# Patient Record
Sex: Female | Born: 1955 | Hispanic: Yes | Marital: Single | Smoking: Former smoker
Health system: Southern US, Community
[De-identification: ages and names within clinical notes are randomized; demographics above are authoritative.]

## PROBLEM LIST (undated history)

## (undated) DIAGNOSIS — M1990 Unspecified osteoarthritis, unspecified site: Secondary | ICD-10-CM

## (undated) DIAGNOSIS — F32A Depression, unspecified: Secondary | ICD-10-CM

## (undated) DIAGNOSIS — R32 Unspecified urinary incontinence: Secondary | ICD-10-CM

## (undated) DIAGNOSIS — F329 Major depressive disorder, single episode, unspecified: Secondary | ICD-10-CM

## (undated) DIAGNOSIS — K635 Polyp of colon: Secondary | ICD-10-CM

## (undated) HISTORY — DX: Unspecified urinary incontinence: R32

## (undated) HISTORY — DX: Depression, unspecified: F32.A

## (undated) HISTORY — PX: ANKLE SURGERY: SHX546

## (undated) HISTORY — PX: BACK SURGERY: SHX140

## (undated) HISTORY — PX: CHOLECYSTECTOMY: SHX55

## (undated) HISTORY — DX: Major depressive disorder, single episode, unspecified: F32.9

## (undated) HISTORY — DX: Unspecified osteoarthritis, unspecified site: M19.90

## (undated) HISTORY — DX: Polyp of colon: K63.5

## (undated) HISTORY — PX: GASTRIC BYPASS: SHX52

---

## 2017-09-02 ENCOUNTER — Encounter: Payer: Self-pay | Admitting: Family Medicine

## 2017-09-02 ENCOUNTER — Ambulatory Visit: Payer: BLUE CROSS/BLUE SHIELD | Admitting: Family Medicine

## 2017-09-02 VITALS — BP 120/84 | HR 62 | Temp 97.8°F | Ht 64.0 in | Wt 233.0 lb

## 2017-09-02 DIAGNOSIS — F319 Bipolar disorder, unspecified: Secondary | ICD-10-CM | POA: Diagnosis not present

## 2017-09-02 DIAGNOSIS — M5441 Lumbago with sciatica, right side: Secondary | ICD-10-CM | POA: Diagnosis not present

## 2017-09-02 DIAGNOSIS — Z7689 Persons encountering health services in other specified circumstances: Secondary | ICD-10-CM

## 2017-09-02 DIAGNOSIS — M797 Fibromyalgia: Secondary | ICD-10-CM | POA: Diagnosis not present

## 2017-09-02 DIAGNOSIS — F4310 Post-traumatic stress disorder, unspecified: Secondary | ICD-10-CM

## 2017-09-02 DIAGNOSIS — G8929 Other chronic pain: Secondary | ICD-10-CM

## 2017-09-02 DIAGNOSIS — Z9884 Bariatric surgery status: Secondary | ICD-10-CM

## 2017-09-02 NOTE — Patient Instructions (Signed)
Myofascial Pain Syndrome and Fibromyalgia Myofascial pain syndrome and fibromyalgia are both pain disorders. This pain may be felt mainly in your muscles.  Myofascial pain syndrome: ? Always has trigger points or tender points in the muscle that will cause pain when pressed. The pain may come and go. ? Usually affects your neck, upper back, and shoulder areas. The pain often radiates into your arms and hands.  Fibromyalgia: ? Has muscle pains and tenderness that come and go. ? Is often associated with fatigue and sleep disturbances. ? Has trigger points. ? Tends to be long-lasting (chronic), but is not life-threatening.  Fibromyalgia and myofascial pain are not the same. However, they often occur together. If you have both conditions, each can make the other worse. Both are common and can cause enough pain and fatigue to make day-to-day activities difficult. What are the causes? The exact causes of fibromyalgia and myofascial pain are not known. People with certain gene types may be more likely to develop fibromyalgia. Some factors can be triggers for both conditions, such as:  Spine disorders.  Arthritis.  Severe injury (trauma) and other physical stressors.  Being under a lot of stress.  A medical illness.  What are the signs or symptoms? Fibromyalgia The main symptom of fibromyalgia is widespread pain and tenderness in your muscles. This can vary over time. Pain is sometimes described as stabbing, shooting, or burning. You may have tingling or numbness, too. You may also have sleep problems and fatigue. You may wake up feeling tired and groggy (fibro fog). Other symptoms may include:  Bowel and bladder problems.  Headaches.  Visual problems.  Problems with odors and noises.  Depression or mood changes.  Painful menstrual periods (dysmenorrhea).  Dry skin or eyes.  Myofascial pain syndrome Symptoms of myofascial pain syndrome include:  Tight, ropy bands of  muscle.  Uncomfortable sensations in muscular areas, such as: ? Aching. ? Cramping. ? Burning. ? Numbness. ? Tingling. ? Muscle weakness.  Trouble moving certain muscles freely (range of motion).  How is this diagnosed? There are no specific tests to diagnose fibromyalgia or myofascial pain syndrome. Both can be hard to diagnose because their symptoms are common in many other conditions. Your health care provider may suspect one or both of these conditions based on your symptoms and medical history. Your health care provider will also do a physical exam. The key to diagnosing fibromyalgia is having pain, fatigue, and other symptoms for more than three months that cannot be explained by another condition. The key to diagnosing myofascial pain syndrome is finding trigger points in muscles that are tender and cause pain elsewhere in your body (referred pain). How is this treated? Treating fibromyalgia and myofascial pain often requires a team of health care providers. This usually starts with your primary provider and a physical therapist. You may also find it helpful to work with alternative health care providers, such as massage therapists or acupuncturists. Treatment for fibromyalgia may include medicines. This may include nonsteroidal anti-inflammatory drugs (NSAIDs), along with other medicines. Treatment for myofascial pain may also include:  NSAIDs.  Cooling and stretching of muscles.  Trigger point injections.  Sound wave (ultrasound) treatments to stimulate muscles.  Follow these instructions at home:  Take medicines only as directed by your health care provider.  Exercise as directed by your health care provider or physical therapist.  Try to avoid stressful situations.  Practice relaxation techniques to control your stress. You may want to try: ? Biofeedback. ? Visual   imagery. ? Hypnosis. ? Muscle relaxation. ? Yoga. ? Meditation.  Talk to your health care provider  about alternative treatments, such as acupuncture or massage treatment.  Maintain a healthy lifestyle. This includes eating a healthy diet and getting enough sleep.  Consider joining a support group.  Do not do activities that stress or strain your muscles. That includes repetitive motions and heavy lifting. Where to find more information:  National Fibromyalgia Association: www.fmaware.org  Arthritis Foundation: www.arthritis.org  American Chronic Pain Association: www.theacpa.org/condition/myofascial-pain Contact a health care provider if:  You have new symptoms.  Your symptoms get worse.  You have side effects from your medicines.  You have trouble sleeping.  Your condition is causing depression or anxiety. This information is not intended to replace advice given to you by your health care provider. Make sure you discuss any questions you have with your health care provider. Document Released: 03/10/2005 Document Revised: 08/16/2015 Document Reviewed: 12/14/2013 Elsevier Interactive Patient Education  2018 Elsevier Inc.  
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
## 2017-10-07 ENCOUNTER — Ambulatory Visit: Payer: BLUE CROSS/BLUE SHIELD | Admitting: Family Medicine

## 2017-10-07 ENCOUNTER — Ambulatory Visit (INDEPENDENT_AMBULATORY_CARE_PROVIDER_SITE_OTHER): Payer: BLUE CROSS/BLUE SHIELD

## 2017-10-07 ENCOUNTER — Encounter: Payer: Self-pay | Admitting: Family Medicine

## 2017-10-07 VITALS — BP 128/84 | HR 58 | Temp 97.8°F | Wt 231.0 lb

## 2017-10-07 DIAGNOSIS — M797 Fibromyalgia: Secondary | ICD-10-CM | POA: Diagnosis not present

## 2017-10-07 DIAGNOSIS — M25511 Pain in right shoulder: Secondary | ICD-10-CM

## 2017-10-07 DIAGNOSIS — G8929 Other chronic pain: Secondary | ICD-10-CM | POA: Diagnosis not present

## 2017-10-07 MED ORDER — TIZANIDINE HCL 4 MG PO TABS: 4.0000 mg | ORAL_TABLET | Freq: Four times a day (QID) | ORAL | 3 refills | Status: DC | PRN

## 2017-10-07 MED ORDER — TIZANIDINE HCL 4 MG PO TABS: 4.0000 mg | ORAL_TABLET | Freq: Four times a day (QID) | ORAL | 3 refills | Status: AC | PRN

## 2017-10-07 NOTE — Patient Instructions (Signed)
Shoulder Pain Many things can cause shoulder pain, including:  An injury to the area.  Overuse of the shoulder.  Arthritis.  The source of the pain can be:  Inflammation.  An injury to the shoulder joint.  An injury to a tendon, ligament, or bone.  Follow these instructions at home: Take these actions to help with your pain:  Squeeze a soft ball or a foam pad as much as possible. This helps to keep the shoulder from swelling. It also helps to strengthen the arm.  Take over-the-counter and prescription medicines only as told by your health care provider.  If directed, apply ice to the area: ? Put ice in a plastic bag. ? Place a towel between your skin and the bag. ? Leave the ice on for 20 minutes, 2-3 times per day. Stop applying ice if it does not help with the pain.  If you were given a shoulder sling or immobilizer: ? Wear it as told. ? Remove it to shower or bathe. ? Move your arm as little as possible, but keep your hand moving to prevent swelling.  Contact a health care provider if:  Your pain gets worse.  Your pain is not relieved with medicines.  New pain develops in your arm, hand, or fingers. Get help right away if:  Your arm, hand, or fingers: ? Tingle. ? Become numb. ? Become swollen. ? Become painful. ? Turn white or blue. This information is not intended to replace advice given to you by your health care provider. Make sure you discuss any questions you have with your health care provider. Document Released: 12/18/2004 Document Revised: 11/04/2015 Document Reviewed: 07/03/2014 Elsevier Interactive Patient Education  2018 Elsevier Inc.  Shoulder Range of Motion Exercises Shoulder range of motion (ROM) exercises are designed to keep the shoulder moving freely. They are often recommended for people who have shoulder pain. Phase 1 exercises When you are able, do this exercise 5-6 days per week, or as told by your health care provider. Work toward  doing 2 sets of 10 swings. Pendulum Exercise How To Do This Exercise Lying Down 1. Lie face-down on a bed with your abdomen close to the side of the bed. 2. Let your arm hang over the side of the bed. 3. Relax your shoulder, arm, and hand. 4. Slowly and gently swing your arm forward and back. Do not use your neck muscles to swing your arm. They should be relaxed. If you are struggling to swing your arm, have someone gently swing it for you. When you do this exercise for the first time, swing your arm at a 15 degree angle for 15 seconds, or swing your arm 10 times. As pain lessens over time, increase the angle of the swing to 30-45 degrees. 5. Repeat steps 1-4 with the other arm.  How To Do This Exercise While Standing 1. Stand next to a sturdy chair or table and hold on to it with your hand. 1. Bend forward at the waist. 2. Bend your knees slightly. 3. Relax your other arm and let it hang limp. 4. Relax the shoulder blade of the arm that is hanging and let it drop. 5. While keeping your shoulder relaxed, use body motion to swing your arm in small circles. The first time you do this exercise, swing your arm for about 30 seconds or 10 times. When you do it next time, swing your arm for a little longer. 6. Stand up tall and relax. 7. Repeat steps   1-7, this time changing the direction of the circles. 2. Repeat steps 1-8 with the other arm.  Phase 2 exercises Do these exercises 3-4 times per day on 5-6 days per week or as told by your health care provider. Work toward holding the stretch for 20 seconds. Stretching Exercise 1 1. Lift your arm straight out in front of you. 2. Bend your arm 90 degrees at the elbow (right angle) so your forearm goes across your body and looks like the letter "L." 3. Use your other arm to gently pull the elbow forward and across your body. 4. Repeat steps 1-3 with the other arm. Stretching Exercise 2 You will need a towel or rope for this exercise. 1. Bend one arm  behind your back with the palm facing outward. 2. Hold a towel with your other hand. 3. Reach the arm that holds the towel above your head, and bend that arm at the elbow. Your wrist should be behind your neck. 4. Use your free hand to grab the free end of the towel. 5. With the higher hand, gently pull the towel up behind you. 6. With the lower hand, pull the towel down behind you. 7. Repeat steps 1-6 with the other arm.  Phase 3 exercises Do each of these exercises at four different times of day (sessions) every day or as told by your health care provider. To begin with, repeat each exercise 5 times (repetitions). Work toward doing 3 sets of 12 repetitions or as told by your health care provider. Strengthening Exercise 1 You will need a light weight for this activity. As you grow stronger, you may use a heavier weight. 1. Standing with a weight in your hand, lift your arm straight out to the side until it is at the same height as your shoulder. 2. Bend your arm at 90 degrees so that your fingers are pointing to the ceiling. 3. Slowly raise your hand until your arm is straight up in the air. 4. Repeat steps 1-3 with the other arm.  Strengthening Exercise 2 You will need a light weight for this activity. As you grow stronger, you may use a heavier weight. 1. Standing with a weight in your hand, gradually move your straight arm in an arc, starting at your side, then out in front of you, then straight up over your head. 2. Gradually move your other arm in an arc, starting at your side, then out in front of you, then straight up over your head. 3. Repeat steps 1-2 with the other arm.  Strengthening Exercise 3 You will need an elastic band for this activity. As you grow stronger, gradually increase the size of the bands or increase the number of bands that you use at one time. 1. While standing, hold an elastic band in one hand and raise that arm up in the air. 2. With your other hand, pull  down the band until that hand is by your side. 3. Repeat steps 1-2 with the other arm.  This information is not intended to replace advice given to you by your health care provider. Make sure you discuss any questions you have with your health care provider. Document Released: 12/07/2002 Document Revised: 11/04/2015 Document Reviewed: 03/06/2014 Elsevier Interactive Patient Education  2018 Elsevier Inc.  

## 2017-10-07 NOTE — Progress Notes (Signed)
Subjective:    Patient ID: Tina Harrison, female    DOB: [birth year], 62 y.o.   MRN: [id number]  No chief complaint on file.   HPI Patient was seen today for ongoing right shoulder pain.  Pt notes some improvement in pain since going to the gym and moving her arm more.  Pt has not taken any pain meds 2/2 h/o substance abuse.  She typically takes zanaflex prn for fibromyalgia and chronic back pain s/p surgery.  Also s/p L shoulder rotator cuff repair.  Of note Zanaflex has to be manufactured by Dr. Betti Cruzeddy, had issues with med from other manufacturers.  H/o bipolar d/o: -pt made an appt with Dr. Milagros Evenerupinder Kaur, but it is in Oct. -pt contacted her previous psychiatrist who is willing to do a telephone visit in order to re-prescribe patient's medications until she can be seen by her new psychiatrist. -Patient is taking Klonopin 2 mg as needed anxiety and Vilazodone 40 mg daily.  Past Medical History:  Diagnosis Date  . Arthritis   . Colon polyps   . Depression   . Urinary incontinence     Allergies  Allergen Reactions  . Morphine And Related     Causes hallucinations     ROS General: Denies fever, chills, night sweats, changes in weight, changes in appetite HEENT: Denies headaches, ear pain, changes in vision, rhinorrhea, sore throat CV: Denies CP, palpitations, SOB, orthopnea Pulm: Denies SOB, cough, wheezing GI: Denies abdominal pain, nausea, vomiting, diarrhea, constipation GU: Denies dysuria, hematuria, frequency, vaginal discharge Msk: Denies muscle cramps, joint pains  +R shoulder pain with movement, fibromyalgia Neuro: Denies weakness, numbness, tingling Skin: Denies rashes, bruising Psych: Denies depression, anxiety, hallucinations +h/o bipolar d/o    Objective:    Blood pressure 128/84, pulse (!) 58, temperature 97.8 F (36.6 C), temperature source Oral, weight 231 lb (104.8 kg), SpO2 98 %.   Gen. Pleasant, well-nourished, in no distress, normal affect  HEENT: [place]/AT,  face symmetric, no scleral icterus, PERRLA,  nares patent without drainage Lungs: no accessory muscle use, CTAB, no wheezes or rales Cardiovascular: RRR, no m/r/g, no peripheral edema Musculoskeletal: No deformities, TTP of mid R clavicle, AC joint, and scapula.  FROM with pain. +cross body, Hawkin's, and empty can.   no cyanosis or clubbing, normal tone  Neuro:  A&Ox3, CN II-XII intact, normal gait Skin:  Warm, no lesions/ rash   Wt Readings from Last 3 Encounters:  10/07/17 231 lb (104.8 kg)  09/02/17 233 lb (105.7 kg)    No results found for: WBC, HGB, HCT, PLT, GLUCOSE, CHOL, TRIG, HDL, LDLDIRECT, LDLCALC, ALT, AST, NA, K, CL, CREATININE, BUN, CO2, TSH, PSA, INR, GLUF, HGBA1C, MICROALBUR  Assessment/Plan:  Chronic right shoulder pain -improving  -heat, massage -continue exercising, given handout of shoulder exercises. -if shoulder pain continues will send to Ortho. - Plan: DG Shoulder Right, tiZANidine (ZANAFLEX) 4 MG tablet TID prn  Fibromyalgia  - Plan: tiZANidine (ZANAFLEX) 4 MG tablet  F/u in the next few months prn  Abbe AmsterdamShannon Banks, MD

## 2017-10-09 ENCOUNTER — Other Ambulatory Visit: Payer: Self-pay | Admitting: Family Medicine

## 2017-10-09 DIAGNOSIS — G8929 Other chronic pain: Secondary | ICD-10-CM

## 2017-10-09 DIAGNOSIS — M25511 Pain in right shoulder: Principal | ICD-10-CM

## 2017-10-15 ENCOUNTER — Ambulatory Visit: Payer: BLUE CROSS/BLUE SHIELD | Admitting: Physical Therapy

## 2017-10-20 ENCOUNTER — Other Ambulatory Visit: Payer: Self-pay

## 2017-10-20 ENCOUNTER — Encounter: Payer: Self-pay | Admitting: Physical Therapy

## 2017-10-20 ENCOUNTER — Ambulatory Visit: Payer: BLUE CROSS/BLUE SHIELD | Attending: Family Medicine | Admitting: Physical Therapy

## 2017-10-20 DIAGNOSIS — M6281 Muscle weakness (generalized): Secondary | ICD-10-CM | POA: Insufficient documentation

## 2017-10-20 DIAGNOSIS — M25611 Stiffness of right shoulder, not elsewhere classified: Secondary | ICD-10-CM | POA: Diagnosis present

## 2017-10-20 DIAGNOSIS — M25511 Pain in right shoulder: Secondary | ICD-10-CM | POA: Insufficient documentation

## 2017-10-20 NOTE — Patient Instructions (Signed)
      Hold all stretches 5-10 seconds and perform 5-10 times, 3 times a day.   Slide right arm up wall, with  PALM FACING THE WALL, by leaning toward wall.  Copyright  VHI. All rights reserved.   Sitting upright, slide forearm forward along table, bending from the waist until a stretch is felt. Copyright  VHI. All rights reserved.    Clasp hands together and raise arms above head, keeping elbows as straight as possible. Can be done sitting or lying.   Copyright  VHI. All rights reserved.    Tina Harrison PT [facility] [address], [address] [place], [place] [address] Phone # [phone] Fax [phone] 

## 2017-10-20 NOTE — Therapy (Signed)
[facility] [address], [address] [place], [place], [address] Phone: [phone]   Fax:  [phone]  Physical Therapy Evaluation  Patient Details  Name: Tina Harrison MRN: [id number] Date of Birth: [birth year] Referring Provider: Dr. Abbe Amsterdam   Encounter Date: 10/20/2017  PT End of Session - 10/20/17 2158    Visit Number  1    Date for PT Re-Evaluation  12/15/17    Authorization Type  BCBS    PT Start Time  1230    PT Stop Time  1310    PT Time Calculation (min)  40 min    Activity Tolerance  Patient tolerated treatment well       Past Medical History:  Diagnosis Date  . Arthritis   . Colon polyps   . Depression   . Urinary incontinence     Past Surgical History:  Procedure Laterality Date  . ANKLE SURGERY    . BACK SURGERY  2013,2017  . CHOLECYSTECTOMY    . GASTRIC BYPASS      There were no vitals filed for this visit.   Subjective Assessment - 10/20/17 1234    Subjective  My shoulder just hurts especially at night.  Started in April but has worsened over time.  I go to the gym and I work through it.  Works with a Psychologist, educational.  It took me 2 years to get to this point at the gym.      Pertinent History  herniated discs in neck 2017 had injections;  2 back surgeries with fusion; neuropathy;  bil carpal tunnel surgery;  left RTC surgery     Limitations  House hold activities    Diagnostic tests  x-ray "tendonitis"    Patient Stated Goals  lessen pain in shoulder and elbow    Currently in Pain?  Yes    Pain Score  6     Pain Location  Shoulder    Pain Orientation  Right;Anterior;Upper;Mid    Pain Type  Chronic pain    Pain Onset  More than a month ago    Aggravating Factors   night it radiates to my elbow on either side; bra strap;  sometimes reaching    Pain Relieving Factors  CBD ointment at night          [facility] PT Assessment - 10/20/17 0001      Assessment   Medical Diagnosis  chronic right shoulder  pain     Referring Provider  Dr. Abbe Amsterdam    Onset Date/Surgical Date  -- April    Hand Dominance  Right    Next MD Visit  as needed    Prior Therapy  other areas      Precautions   Precautions  None      Restrictions   Weight Bearing Restrictions  No      Balance Screen   Has the patient fallen in the past 6 months  No    Has the patient had a decrease in activity level because of a fear of falling?   No    Is the patient reluctant to leave their home because of a fear of falling?   No      Home Environment   Living Environment  Private residence    Living Arrangements  Alone      Prior Function   Level of Independence  Independent with basic ADLs    Vocation  Retired    Leisure  going to  the gym      Observation/Other Assessments   Focus on Therapeutic Outcomes (FOTO)   52% limitation       Posture/Postural Control   Posture/Postural Control  No significant limitations      AROM   Right Shoulder Flexion  110 Degrees    Right Shoulder ABduction  109 Degrees    Right Shoulder Internal Rotation  -- T 10    Right Shoulder External Rotation  48 Degrees    Left Shoulder Flexion  135 Degrees    Left Shoulder ABduction  150 Degrees    Left Shoulder Internal Rotation  -- T 10    Left Shoulder External Rotation  23 Degrees      Strength   Right Shoulder Flexion  3/5    Right Shoulder ABduction  3/5    Right Shoulder Internal Rotation  3/5    Right Shoulder External Rotation  3/5    Left Shoulder Flexion  4/5    Left Shoulder Extension  4/5    Left Shoulder ABduction  4/5    Left Shoulder Internal Rotation  4/5    Left Shoulder External Rotation  4/5      Hawkins-Kennedy test   Findings  Positive    Side  Right      Empty Can test   Findings  Positive    Side  Right      Lag time at 0 degrees   Findings  Positive    Side  Right      Drop Arm test   Findings  Positive    Side  Right                Objective measurements completed on  examination: See above findings.              PT Education - 10/20/17 2158    Education Details  Active assisted shoulder ROM 3x/day     Person(s) Educated  Patient    Methods  Explanation;Demonstration;Handout    Comprehension  Returned demonstration;Verbalized understanding       PT Short Term Goals - 10/20/17 2213      PT SHORT TERM GOAL #1   Title  The patient will demonstrate basic knowledge of pain control strategies and exercise to promote healing    Time  4    Period  Weeks    Status  New    Target Date  11/17/17      PT SHORT TERM GOAL #2   Title  The patient will report a 30% improvement in right shoulder pain at night time and usual ADLs    Time  4    Period  Weeks    Status  New      PT SHORT TERM GOAL #3   Title  The patient will have improved right shoulder flexion and abduction to 120 degrees needed for reaching    Time  4    Period  Weeks    Status  New        PT Long Term Goals - 10/20/17 2215      PT LONG TERM GOAL #1   Title  The patient will be independent with HEP and appropriate gym program for right shoulder    Time  8    Period  Weeks    Status  New    Target Date  12/15/17      PT LONG TERM GOAL #2   Title  The patient will  report a 60% reduction in shoulder pain at night time and with reaching     Time  8    Period  Weeks    Status  New      PT LONG TERM GOAL #3   Title  The patient will have improved right shoulder ROM with flexion and abduction  to 130 degrees, external rotation to 55 degrees needed for grooming, dressing, reaching     Time  8    Period  Weeks    Status  New      PT LONG TERM GOAL #4   Title  Right shoulder strength to grossly 4-/5 needed for light lifting    Time  8    Period  Weeks    Status  New      PT LONG TERM GOAL #5   Title  FOTO functional outcome score improved from 52% limitation to 36% indicating improved function with less pain     Time  8    Period  Weeks    Status  New              Plan - 10/20/17 2159    Clinical Impression Statement  The patient reports she had the onset of right shoulder pain in April for no apparent reason.  Her pain is especially bad at night time with sidelying on either side.  She also has some pain with reaching.  She has limited and painful ROM in all planes. Her strength is decreased grossly 3/5 throughout with pain with resisted movements.  Positive rotator cuff tests.  She would benefit from PT to address these deficits.      History and Personal Factors relevant to plan of care:  numerous co-morbidities including 2 back surgeries, cervical HNP, bilateral carpal tunnel surgeries, peripheral neuropathy; previous left rotator cuff surgery;  psychiatric disorder    Clinical Presentation  Evolving    Clinical Presentation due to:  worsening over time; numerous co-morbidities, numerous body regions and systems affected;  lives alone     Clinical Decision Making  Moderate    Rehab Potential  Good    Clinical Impairments Affecting Rehab Potential  see numerous co-morbidities above    PT Frequency  2x / week    PT Duration  8 weeks    PT Treatment/Interventions  ADLs/Self Care Home Management;Cryotherapy;Electrical Stimulation;Ultrasound;Moist Heat;Iontophoresis 4mg /ml Dexamethasone;Therapeutic exercise;Therapeutic activities;Neuromuscular re-education;Patient/family education;Dry needling;Taping    PT Next Visit Plan  review right shoulder active assisted ROM;  add isometrics;  glenohumeral and scapular mobs, ionto if cert signed;  TENS for pain control     Consulted and Agree with Plan of Care  Patient       Patient will benefit from skilled therapeutic intervention in order to improve the following deficits and impairments:  Pain, Decreased range of motion, Decreased strength, Impaired UE functional use  Visit Diagnosis: Acute pain of right shoulder - Plan: PT plan of care cert/re-cert  Stiffness of right shoulder, not elsewhere  classified - Plan: PT plan of care cert/re-cert  Muscle weakness (generalized) - Plan: PT plan of care cert/re-cert     Problem List Patient Active Problem List   Diagnosis Date Noted  .  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 Bipolar affective disorder (HCC) 09/02/2017  . 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 PTSD (post-traumatic stress disorder) 09/02/2017  . Fibromyalgia 09/02/2017   Lavinia Sharps, PT 10/20/17 10:25 PM Phone: [phone] Fax: [phone]  Lavinia Sharps  C 10/20/2017, 10:25 PM  [facility] [address], [address] [place], [place], [address] Phone: [phone]   Fax:  [phone]  Name: Imonie Tuch MRN: [id number] Date of Birth: 01-27-1956

## 2017-10-27 ENCOUNTER — Ambulatory Visit: Payer: BLUE CROSS/BLUE SHIELD | Attending: Family Medicine | Admitting: Physical Therapy

## 2017-10-27 ENCOUNTER — Encounter: Payer: Self-pay | Admitting: Physical Therapy

## 2017-10-27 DIAGNOSIS — M25611 Stiffness of right shoulder, not elsewhere classified: Secondary | ICD-10-CM | POA: Diagnosis present

## 2017-10-27 DIAGNOSIS — M6281 Muscle weakness (generalized): Secondary | ICD-10-CM | POA: Insufficient documentation

## 2017-10-27 DIAGNOSIS — M25511 Pain in right shoulder: Secondary | ICD-10-CM | POA: Diagnosis present

## 2017-10-27 NOTE — Therapy (Signed)
[facility] [address] [place], [place], [address] Phone: [phone]   Fax:  [phone]  Physical Therapy Treatment  Patient Details  Name: Tina Harrison MRN: [id number] Date of Birth: Jan 28, 1956 Referring Provider: Dr. Abbe Amsterdam   Encounter Date: 10/27/2017  PT End of Session - 10/27/17 1222    Visit Number  2    Date for PT Re-Evaluation  12/15/17    Authorization Type  BCBS    PT Start Time  1215    PT Stop Time  1300    PT Time Calculation (min)  45 min    Activity Tolerance  Patient tolerated treatment well       Past Medical History:  Diagnosis Date  . Arthritis   . Colon polyps   . Depression   . Urinary incontinence     Past Surgical History:  Procedure Laterality Date  . ANKLE SURGERY    . BACK SURGERY  2013,2017  . CHOLECYSTECTOMY    . GASTRIC BYPASS      There were no vitals filed for this visit.  Subjective Assessment - 10/27/17 1216    Subjective  My shoulder is in a lot of pain today and I wanted to go to the doctor to get a shot or something    Pertinent History  herniated discs in neck 2017 had injections;  2 back surgeries with fusion; neuropathy;  bil carpal tunnel surgery;  left RTC surgery     Limitations  House hold activities    Diagnostic tests  x-ray "tendonitis"    Patient Stated Goals  lessen pain in shoulder and elbow    Currently in Pain?  Yes    Pain Score  10-Worst pain ever    Pain Location  Shoulder    Pain Orientation  Right    Pain Descriptors / Indicators  Constant    Pain Type  Chronic pain    Pain Radiating Towards  pain radiating to the elbow    Pain Onset  More than a month ago    Aggravating Factors   nothing    Pain Relieving Factors  nothing    Multiple Pain Sites  No                       [facility] Adult PT Treatment/Exercise - 10/27/17 0001      Shoulder Exercises: Standing   Flexion  AAROM;Strengthening;Right;10 reps;Limitations     Flexion Limitations  using cane only getting to shoulder height and fatigues      Shoulder Exercises: Pulleys   Flexion  3 minutes      Shoulder Exercises: Isometric Strengthening   Flexion  5X5"    Extension  5X5"    External Rotation  5X5"    Internal Rotation  5X5"    ABduction  5X5"      Modalities   Modalities  Ultrasound;Iontophoresis      Ultrasound   Ultrasound Location  right shoulder and right elbow    Ultrasound Parameters  20% duty, 1.3 intensity; 5 min shoulder, 4 min elbow    Ultrasound Goals  Pain      Iontophoresis   Type of Iontophoresis  Dexamethasone    Location  right anterior shoulder    Dose  1.52mL    Time  4-6 hour release treatment #1             PT Education - 10/27/17 1312    Education Details  Access Code: [email or web address]     Person(s) Educated  Patient    Methods  Explanation;Demonstration;Verbal cues;Handout;Tactile cues    Comprehension  Verbalized understanding;Returned demonstration       PT Short Term Goals - 10/20/17 2213      PT SHORT TERM GOAL #1   Title  The patient will demonstrate basic knowledge of pain control strategies and exercise to promote healing    Time  4    Period  Weeks    Status  New    Target Date  11/17/17      PT SHORT TERM GOAL #2   Title  The patient will report a 30% improvement in right shoulder pain at night time and usual ADLs    Time  4    Period  Weeks    Status  New      PT SHORT TERM GOAL #3   Title  The patient will have improved right shoulder flexion and abduction to 120 degrees needed for reaching    Time  4    Period  Weeks    Status  New        PT Long Term Goals - 10/20/17 2215      PT LONG TERM GOAL #1   Title  The patient will be independent with HEP and appropriate gym program for right shoulder    Time  8    Period  Weeks    Status  New    Target Date  12/15/17      PT LONG TERM GOAL #2   Title  The patient will report a 60% reduction in shoulder pain at night time and  with reaching     Time  8    Period  Weeks    Status  New      PT LONG TERM GOAL #3   Title  The patient will have improved right shoulder ROM with flexion and abduction  to 130 degrees, external rotation to 55 degrees needed for grooming, dressing, reaching     Time  8    Period  Weeks    Status  New      PT LONG TERM GOAL #4   Title  Right shoulder strength to grossly 4-/5 needed for light lifting    Time  8    Period  Weeks    Status  New      PT LONG TERM GOAL #5   Title  FOTO functional outcome score improved from 52% limitation to 36% indicating improved function with less pain     Time  8    Period  Weeks    Status  New            Plan - 10/27/17 1312    Clinical Impression Statement  Pt presents with reports of very severe pain today, but facial expression does not match 10/10 pain level.  Pt tolerated exercises well and needed cues for improved posture and more scapular retraction and depression.  Pt had to immediate relief from US today.  Pt will benefit from skilled PT to address impairments.    PT Treatment/Interventions  ADLs/Self Care Home Management;Cryotherapy;Electrical Stimulation;Ultrasound;Moist Heat;Iontophoresis 4mg /ml Dexamethasone;Therapeutic exercise;Therapeutic activities;Neuromuscular re-education;Patient/family education;Dry needling;Taping    PT Next Visit Plan  f/u on HEP and response to ionto #1, AAROM, gentle strengthening as tolerated, estim and heat/ice for pain management    Consulted and Agree with Plan of Care  Patient       Patient will  benefit from skilled therapeutic intervention in order to improve the following deficits and impairments:  Pain, Decreased range of motion, Decreased strength, Impaired UE functional use  Visit Diagnosis: Acute pain of right shoulder  Stiffness of right shoulder, not elsewhere classified  Muscle weakness (generalized)     Problem List Patient Active Problem List   Diagnosis Date Noted  .  s/p  gastric bypass x 2 09/02/2017  . Bipolar affective disorder (HCC) 09/02/2017  . 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 PTSD (post-traumatic stress disorder) 09/02/2017  . Fibromyalgia 09/02/2017    Vincente Poli, PT 10/27/2017, 1:23 PM  [facility] [address], [address] [place], [place], [address] Phone: [phone]   Fax:  [phone]  Name: Tina Harrison MRN: [id number] Date of Birth: 11/24/1955

## 2017-10-27 NOTE — Patient Instructions (Signed)
Access Code: [email or web address]  URL: [email or web address]  Date: 10/27/2017  Prepared by: Dorie RankJacqueline Crosser   Exercises  Isometric Shoulder Flexion at Wall - 10 reps - 1 sets - 5 sec hold - 1x daily - 7x weekly  Isometric Shoulder Extension at Wall - 10 reps - 1 sets - 5 sec hold - 1x daily - 7x weekly  Isometric Shoulder External Rotation at Wall - 10 reps - 1 sets - 5 sec hold - 1x daily - 7x weekly  Standing Isometric Shoulder Internal Rotation at Doorway - 10 reps - 1 sets - 5 sec hold - 1x daily - 7x weekly  Isometric Shoulder Abduction at Wall - 10 reps - 1 sets - 5 sec hold - 1x daily - 7x weekly  Standing Shoulder Flexion AAROM with Dowel - 10 reps - 3 sets - 1x daily - 7x weekly  [facility] [address], [address] [place], [place] [address] Phone # [phone] Fax [phone]

## 2017-10-29 ENCOUNTER — Encounter: Payer: Self-pay | Admitting: Physical Therapy

## 2017-10-29 ENCOUNTER — Ambulatory Visit: Payer: BLUE CROSS/BLUE SHIELD | Admitting: Physical Therapy

## 2017-10-29 DIAGNOSIS — M6281 Muscle weakness (generalized): Secondary | ICD-10-CM

## 2017-10-29 DIAGNOSIS — M25611 Stiffness of right shoulder, not elsewhere classified: Secondary | ICD-10-CM

## 2017-10-29 DIAGNOSIS — M25511 Pain in right shoulder: Secondary | ICD-10-CM | POA: Diagnosis not present

## 2017-10-29 NOTE — Therapy (Signed)
[facility] [address], [address] [place], [place], [address] Phone: [phone]   Fax:  [phone]  Physical Therapy Treatment  Patient Details  Name: Tina Harrison MRN: [id number] Date of Birth: [birth year] Referring Provider: Dr. Abbe Amsterdam   Encounter Date: 10/29/2017  PT End of Session - 10/29/17 1309    Visit Number  3    Date for PT Re-Evaluation  12/15/17    Authorization Type  BCBS    PT Start Time  1230    PT Stop Time  1312    PT Time Calculation (min)  42 min       Past Medical History:  Diagnosis Date  . Arthritis   . Colon polyps   . Depression   . Urinary incontinence     Past Surgical History:  Procedure Laterality Date  . ANKLE SURGERY    . BACK SURGERY  2013,2017  . CHOLECYSTECTOMY    . GASTRIC BYPASS      There were no vitals filed for this visit.  Subjective Assessment - 10/29/17 1234    Subjective  My shoulder is a little better today but I haven't done anything today.    Patient Stated Goals  lessen pain in shoulder and elbow    Currently in Pain?  Yes    Pain Score  8     Pain Location  Shoulder    Pain Orientation  Right    Pain Descriptors / Indicators  Constant    Pain Type  Chronic pain    Pain Radiating Towards  to the elbow    Pain Onset  More than a month ago    Pain Frequency  Intermittent    Aggravating Factors   using it more, moving the arm    Pain Relieving Factors  rest    Multiple Pain Sites  No         [facility] PT Assessment - 10/29/17 0001      AROM   Right Shoulder Flexion  117 Degrees    Right Shoulder ABduction  60 Degrees                   [facility] Adult PT Treatment/Exercise - 10/29/17 0001      Shoulder Exercises: Supine   External Rotation  AAROM;Right;20 reps    Flexion  AAROM;Right;20 reps    ABduction  AAROM;Right;20 reps      Shoulder Exercises: Sidelying   External Rotation  Strengthening;Right;20 reps      Shoulder Exercises:  Standing   Flexion  AAROM;Strengthening;Right;20 reps      Shoulder Exercises: ROM/Strengthening   Other ROM/Strengthening Exercises  finger ladder to level 15 x 15; flexion towel slide 15x      Modalities   Modalities  Moist Heat;Electrical Stimulation      Moist Heat Therapy   Number Minutes Moist Heat  15 Minutes    Moist Heat Location  Shoulder      Electrical Stimulation   Electrical Stimulation Location  Rt shoulder    Electrical Stimulation Action  IFC    Electrical Stimulation Parameters  to tolerance x 11 min    Electrical Stimulation Goals  Pain      Iontophoresis   Type of Iontophoresis  Dexamethasone    Location  right anterior shoulder    Dose  1.76mL    Time  4-6 hour release               PT Short Term  Goals - 10/29/17 1239      PT SHORT TERM GOAL #1   Title  The patient will demonstrate basic knowledge of pain control strategies and exercise to promote healing    Baseline  uses biofreeze and CBD ointment  at night    Status  Achieved      PT SHORT TERM GOAL #2   Title  The patient will report a 30% improvement in right shoulder pain at night time and usual ADLs    Status  On-going      PT SHORT TERM GOAL #3   Title  The patient will have improved right shoulder flexion and abduction to 120 degrees needed for reaching    Status  On-going        PT Long Term Goals - 10/20/17 2215      PT LONG TERM GOAL #1   Title  The patient will be independent with HEP and appropriate gym program for right shoulder    Time  8    Period  Weeks    Status  New    Target Date  12/15/17      PT LONG TERM GOAL #2   Title  The patient will report a 60% reduction in shoulder pain at night time and with reaching     Time  8    Period  Weeks    Status  New      PT LONG TERM GOAL #3   Title  The patient will have improved right shoulder ROM with flexion and abduction  to 130 degrees, external rotation to 55 degrees needed for grooming, dressing, reaching      Time  8    Period  Weeks    Status  New      PT LONG TERM GOAL #4   Title  Right shoulder strength to grossly 4-/5 needed for light lifting    Time  8    Period  Weeks    Status  New      PT LONG TERM GOAL #5   Title  FOTO functional outcome score improved from 52% limitation to 36% indicating improved function with less pain     Time  8    Period  Weeks    Status  New            Plan - 10/29/17 1310    Clinical Impression Statement  Pt still having severe pain throughout the day.  Pt states she was able to do the isometric exercises.  She has some improvement of shoulder flexion but decreased abduction today.  Pt reported feeling better with stim and heat at the end of treatment today.  Pt will benefit from skilled PT to work on posture and shoulder strength    PT Treatment/Interventions  ADLs/Self Care Home Management;Cryotherapy;Electrical Stimulation;Ultrasound;Moist Heat;Iontophoresis 4mg /ml Dexamethasone;Therapeutic exercise;Therapeutic activities;Neuromuscular re-education;Patient/family education;Dry needling;Taping    PT Next Visit Plan  gentle strength and ROM as tolerated, scapular mobility and stability, posture, stim and heat for pain relief, f/u on ionto       Patient will benefit from skilled therapeutic intervention in order to improve the following deficits and impairments:  Pain, Decreased range of motion, Decreased strength, Impaired UE functional use  Visit Diagnosis: Acute pain of right shoulder  Stiffness of right shoulder, not elsewhere classified  Muscle weakness (generalized)     Problem List Patient Active Problem List   Diagnosis Date Noted  .  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  Bipolar affective disorder (HCC) 09/02/2017  . 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 PTSD (post-traumatic stress disorder) 09/02/2017  . Fibromyalgia 09/02/2017    Vincente PoliJakki Crosser, PT 10/29/2017, 1:46 PM  [facility] [address], [address] [place], [place], [address] Phone: [phone]   Fax:  [phone]  Name: Cheron Schaumannlma Sturdy MRN: [id number] Date of Birth: 1955-06-08

## 2017-11-03 ENCOUNTER — Ambulatory Visit: Payer: BLUE CROSS/BLUE SHIELD | Admitting: Physical Therapy

## 2017-11-03 ENCOUNTER — Encounter: Payer: Self-pay | Admitting: Physical Therapy

## 2017-11-03 DIAGNOSIS — M25511 Pain in right shoulder: Secondary | ICD-10-CM | POA: Diagnosis not present

## 2017-11-03 DIAGNOSIS — M6281 Muscle weakness (generalized): Secondary | ICD-10-CM

## 2017-11-03 DIAGNOSIS — M25611 Stiffness of right shoulder, not elsewhere classified: Secondary | ICD-10-CM

## 2017-11-03 NOTE — Patient Instructions (Signed)
Access Code: [email or web address]  URL: [email or web address]  Date: 11/03/2017  Prepared by: Dorie RankJacqueline Crosser   Exercises  Isometric Shoulder Flexion at Wall - 10 reps - 1 sets - 5 sec hold - 1x daily - 7x weekly  Isometric Shoulder Extension at Wall - 10 reps - 1 sets - 5 sec hold - 1x daily - 7x weekly  Isometric Shoulder External Rotation at Wall - 10 reps - 1 sets - 5 sec hold - 1x daily - 7x weekly  Standing Isometric Shoulder Internal Rotation at Doorway - 10 reps - 1 sets - 5 sec hold - 1x daily - 7x weekly  Isometric Shoulder Abduction at Wall - 10 reps - 1 sets - 5 sec hold - 1x daily - 7x weekly  Standing Shoulder Flexion AAROM with Dowel - 10 reps - 3 sets - 1x daily - 7x weekly  Supine Head Nod Deep Neck Flexor Training - 10 reps - 1 sets - 5 sec hold - 2x daily - 7x weekly  Seated Scapular Retraction - 10 reps - 1 sets - 5 sec hold - 3x daily - 7x weekly

## 2017-11-03 NOTE — Therapy (Signed)
[facility] [address] [place], [place], [address] Phone: [phone]   Fax:  [phone]  Physical Therapy Treatment  Patient Details  Name: Tina Harrison MRN: [id number] Date of Birth: [birth year] Referring Provider: Dr. Abbe Amsterdam   Encounter Date: 11/03/2017  PT End of Session - 11/03/17 1229    Visit Number  4    Date for PT Re-Evaluation  12/15/17    Authorization Type  BCBS    PT Start Time  1229    PT Stop Time  1317    PT Time Calculation (min)  48 min    Activity Tolerance  Patient tolerated treatment well       Past Medical History:  Diagnosis Date  . Arthritis   . Colon polyps   . Depression   . Urinary incontinence     Past Surgical History:  Procedure Laterality Date  . ANKLE SURGERY    . BACK SURGERY  2013,2017  . CHOLECYSTECTOMY    . GASTRIC BYPASS      There were no vitals filed for this visit.  Subjective Assessment - 11/03/17 1238    Subjective  I try to keep moving because I don't want to get stiff.  I feel pain sometimes in the side and sometimes under the arm, sometimes in the wrist and elbow.  Pt states the ionto helped that day and when it was on, but not long term feeling better.    Pertinent History  herniated discs in neck 2017 had injections;  2 back surgeries with fusion; neuropathy;  bil carpal tunnel surgery;  left RTC surgery     Diagnostic tests  x-ray "tendonitis"    Patient Stated Goals  lessen pain in shoulder and elbow    Currently in Pain?  Yes    Pain Score  8     Pain Location  Shoulder    Pain Orientation  Right    Pain Descriptors / Indicators  Constant    Pain Type  Chronic pain    Pain Radiating Towards  all the way down to the wrist and under the armpit    Pain Onset  More than a month ago    Pain Frequency  Intermittent    Aggravating Factors   moving    Pain Relieving Factors  rest    Effect of Pain on Daily Activities  doing things    Multiple  Pain Sites  No                       [facility] Adult PT Treatment/Exercise - 11/03/17 0001      Shoulder Exercises: Supine   Other Supine Exercises  head nods, scap squeeze - 10x 5sec      Shoulder Exercises: Seated   Other Seated Exercises  scap squeeze 10x 5 sec    Other Seated Exercises  upper trap stretches - 3 x 10 sec      Shoulder Exercises: Pulleys   Flexion  3 minutes   cues for posture     Moist Heat Therapy   Number Minutes Moist Heat  15 Minutes    Moist Heat Location  Shoulder      Electrical Stimulation   Electrical Stimulation Location  Rt shoulder    Electrical Stimulation Action  IFC    Electrical Stimulation Parameters  to tolerance x 15 min    Electrical Stimulation Goals  Pain      Manual Therapy  Manual Therapy  Soft tissue mobilization    Manual therapy comments  supine pillow under knees    Soft tissue mobilization  gentle soft tissue to right upper trap, suboccipitals, cervical distraction             PT Education - 11/03/17 1309    Education Details   Access Code: [email or web address]     Person(s) Educated  Patient    Methods  Explanation;Demonstration;Handout    Comprehension  Verbalized understanding;Returned demonstration       PT Short Term Goals - 10/29/17 1239      PT SHORT TERM GOAL #1   Title  The patient will demonstrate basic knowledge of pain control strategies and exercise to promote healing    Baseline  uses biofreeze and CBD ointment  at night    Status  Achieved      PT SHORT TERM GOAL #2   Title  The patient will report a 30% improvement in right shoulder pain at night time and usual ADLs    Status  On-going      PT SHORT TERM GOAL #3   Title  The patient will have improved right shoulder flexion and abduction to 120 degrees needed for reaching    Status  On-going        PT Long Term Goals - 10/20/17 2215      PT LONG TERM GOAL #1   Title  The patient will be independent with HEP and appropriate gym program  for right shoulder    Time  8    Period  Weeks    Status  New    Target Date  12/15/17      PT LONG TERM GOAL #2   Title  The patient will report a 60% reduction in shoulder pain at night time and with reaching     Time  8    Period  Weeks    Status  New      PT LONG TERM GOAL #3   Title  The patient will have improved right shoulder ROM with flexion and abduction  to 130 degrees, external rotation to 55 degrees needed for grooming, dressing, reaching     Time  8    Period  Weeks    Status  New      PT LONG TERM GOAL #4   Title  Right shoulder strength to grossly 4-/5 needed for light lifting    Time  8    Period  Weeks    Status  New      PT LONG TERM GOAL #5   Title  FOTO functional outcome score improved from 52% limitation to 36% indicating improved function with less pain     Time  8    Period  Weeks    Status  New            Plan - 11/03/17 1309    Clinical Impression Statement  Pt continues to have high level of pain but states no increased pain with current exercises.  Patient needed cues for posture during seated exercises.  Patient has very rounded shoudlers with forward head.  She was able to feel muscle fatigue with postural exercises today.  Pt could not tolerate much pressure with STM so was not able to experience much release.  PT will benefit from skilled PT to continue with POC    PT Treatment/Interventions  ADLs/Self Care Home Management;Cryotherapy;Electrical Stimulation;Ultrasound;Moist Heat;Iontophoresis 4mg /ml Dexamethasone;Therapeutic exercise;Therapeutic activities;Neuromuscular re-education;Patient/family education;Dry needling;Taping  PT Next Visit Plan  gentle strength and ROM as tolerated, scapular mobility and stability, cervical posture, stim and heat for pain relief    PT Home Exercise Plan   Access Code: [email or web address]     Consulted and Agree with Plan of Care  Patient       Patient will benefit from skilled therapeutic intervention in order  to improve the following deficits and impairments:  Pain, Decreased range of motion, Decreased strength, Impaired UE functional use  Visit Diagnosis: Acute pain of right shoulder  Stiffness of right shoulder, not elsewhere classified  Muscle weakness (generalized)     Problem List Patient Active Problem List   Diagnosis Date Noted  .  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 Bipolar affective disorder (HCC) 09/02/2017  . 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 PTSD (post-traumatic stress disorder) 09/02/2017  . Fibromyalgia 09/02/2017    Vincente PoliJakki Crosser, PT 11/03/2017, 1:18 PM  [facility] [address], [address] [place], [place], [address] Phone: [phone]   Fax:  [phone]  Name: Tina Harrison MRN: [id number] Date of Birth: 07-01-1955

## 2017-11-05 ENCOUNTER — Encounter: Payer: BLUE CROSS/BLUE SHIELD | Admitting: Physical Therapy

## 2017-11-11 ENCOUNTER — Ambulatory Visit: Payer: BLUE CROSS/BLUE SHIELD | Admitting: Physical Therapy

## 2017-11-11 ENCOUNTER — Encounter: Payer: Self-pay | Admitting: Physical Therapy

## 2017-11-11 DIAGNOSIS — M25611 Stiffness of right shoulder, not elsewhere classified: Secondary | ICD-10-CM

## 2017-11-11 DIAGNOSIS — M25511 Pain in right shoulder: Secondary | ICD-10-CM

## 2017-11-11 DIAGNOSIS — M6281 Muscle weakness (generalized): Secondary | ICD-10-CM

## 2017-11-11 NOTE — Therapy (Signed)
[facility] [address], [address] [place], [place], [address] Phone: [phone]   Fax:  [phone]  Physical Therapy Treatment  Patient Details  Name: Tina Harrison Ostermiller MRN: [id number] Date of Birth: July 19, 1955 Referring Provider: Dr. Abbe AmsterdamShannon Banks   Encounter Date: 11/11/2017  PT End of Session - 11/11/17 1149    Visit Number  5    Date for PT Re-Evaluation  12/15/17    Authorization Type  BCBS    PT Start Time  1148    PT Stop Time  1235    PT Time Calculation (min)  47 min    Activity Tolerance  Patient tolerated treatment well       Past Medical History:  Diagnosis Date  . Arthritis   . Colon polyps   . Depression   . Urinary incontinence     Past Surgical History:  Procedure Laterality Date  . ANKLE SURGERY    . BACK SURGERY  2013,2017  . CHOLECYSTECTOMY    . GASTRIC BYPASS      There were no vitals filed for this visit.  Subjective Assessment - 11/11/17 1155    Subjective  I felt really sore after the last treatment.      Currently in Pain?  Yes    Pain Score  9     Pain Location  Shoulder    Pain Orientation  Right    Pain Descriptors / Indicators  Constant    Pain Type  Chronic pain    Pain Radiating Towards  down under the arm to the wrist    Pain Onset  More than a month ago    Pain Frequency  Constant    Aggravating Factors   moving    Pain Relieving Factors  rest    Multiple Pain Sites  No                       [facility] Adult PT Treatment/Exercise - 11/11/17 0001      Shoulder Exercises: Supine   Horizontal ABduction  Strengthening;Both;10 reps;Theraband    Theraband Level (Shoulder Horizontal ABduction)  Level 1 (Yellow)    External Rotation  Strengthening;Both;10 reps;Theraband    Theraband Level (Shoulder External Rotation)  Level 1 (Yellow)    Flexion  AAROM;Right;20 reps;AROM   10 no cane; 10 with cane   Other Supine Exercises  UE ranger - standing flexion level 22 x  20; UE ranger sitting ER/IR 20x    Other Supine Exercises  circles and up and down      Shoulder Exercises: Standing   External Rotation  Strengthening;Both;10 reps;Theraband    Theraband Level (Shoulder External Rotation)  Level 1 (Yellow)      Shoulder Exercises: Isometric Strengthening   External Rotation  5X5"      Moist Heat Therapy   Number Minutes Moist Heat  15 Minutes    Moist Heat Location  Shoulder      Electrical Stimulation   Electrical Stimulation Location  Rt shoulder    Electrical Stimulation Action  IFC    Electrical Stimulation Parameters  to tolerance    Electrical Stimulation Goals  Pain               PT Short Term Goals - 10/29/17 1239      PT SHORT TERM GOAL #1   Title  The patient will demonstrate basic knowledge of pain control strategies and exercise to promote healing    Baseline  uses biofreeze and CBD ointment  at night    Status  Achieved      PT SHORT TERM GOAL #2   Title  The patient will report a 30% improvement in right shoulder pain at night time and usual ADLs    Status  On-going      PT SHORT TERM GOAL #3   Title  The patient will have improved right shoulder flexion and abduction to 120 degrees needed for reaching    Status  On-going        PT Long Term Goals - 10/20/17 2215      PT LONG TERM GOAL #1   Title  The patient will be independent with HEP and appropriate gym program for right shoulder    Time  8    Period  Weeks    Status  New    Target Date  12/15/17      PT LONG TERM GOAL #2   Title  The patient will report a 60% reduction in shoulder pain at night time and with reaching     Time  8    Period  Weeks    Status  New      PT LONG TERM GOAL #3   Title  The patient will have improved right shoulder ROM with flexion and abduction  to 130 degrees, external rotation to 55 degrees needed for grooming, dressing, reaching     Time  8    Period  Weeks    Status  New      PT LONG TERM GOAL #4   Title  Right  shoulder strength to grossly 4-/5 needed for light lifting    Time  8    Period  Weeks    Status  New      PT LONG TERM GOAL #5   Title  FOTO functional outcome score improved from 52% limitation to 36% indicating improved function with less pain     Time  8    Period  Weeks    Status  New            Plan - 11/11/17 1358    Clinical Impression Statement  Pt was very sore reporting 9/10 today.  Pt had clicking and popping with shoulder flexion to 90 degrees when lying in supine which was minimized with using the cane for assistance.  Pt did well with ranger.  She demonstrates significant weakness with ER.  Pt will benfit from skilled PT to continue working on UE strength    PT Treatment/Interventions  ADLs/Self Care Home Management;Cryotherapy;Electrical Stimulation;Ultrasound;Moist Heat;Iontophoresis 4mg /ml Dexamethasone;Therapeutic exercise;Therapeutic activities;Neuromuscular re-education;Patient/family education;Dry needling;Taping    PT Next Visit Plan  gentle strength and ROM as tolerated, scapular mobility and stability, cervical posture, stim and heat for pain relief    PT Home Exercise Plan   Access Code: [email or web address]     Consulted and Agree with Plan of Care  Patient       Patient will benefit from skilled therapeutic intervention in order to improve the following deficits and impairments:  Pain, Decreased range of motion, Decreased strength, Impaired UE functional use  Visit Diagnosis: Acute pain of right shoulder  Stiffness of right shoulder, not elsewhere classified  Muscle weakness (generalized)     Problem List Patient Active Problem List   Diagnosis Date Noted  .  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 Bipolar affective disorder (HCC) 09/02/2017  . 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  PTSD (post-traumatic stress disorder) 09/02/2017  . Fibromyalgia 09/02/2017    Vincente PoliJakki Crosser, PT 11/11/2017, 2:00 PM  [facility] [address], [address] [place], [place], [address] Phone: [phone]   Fax:  [phone]  Name: Tina Harrison Armijo MRN: [id number] Date of Birth: 08-Jun-1955

## 2017-11-12 ENCOUNTER — Encounter: Payer: BLUE CROSS/BLUE SHIELD | Admitting: Physical Therapy

## 2017-11-17 ENCOUNTER — Encounter: Payer: BLUE CROSS/BLUE SHIELD | Admitting: Physical Therapy

## 2017-11-19 ENCOUNTER — Ambulatory Visit: Payer: BLUE CROSS/BLUE SHIELD | Admitting: Physical Therapy

## 2017-11-19 DIAGNOSIS — M25511 Pain in right shoulder: Secondary | ICD-10-CM | POA: Diagnosis not present

## 2017-11-19 DIAGNOSIS — M25611 Stiffness of right shoulder, not elsewhere classified: Secondary | ICD-10-CM

## 2017-11-19 DIAGNOSIS — M6281 Muscle weakness (generalized): Secondary | ICD-10-CM

## 2017-11-19 NOTE — Therapy (Signed)
[facility] [address] [place], [place], [address] Phone: [phone]   Fax:  [phone]  Physical Therapy Treatment  Patient Details  Name: Tina Harrison MRN: [id number] Date of Birth: [birth year] Referring Provider: Dr. Abbe Amsterdam   Encounter Date: 11/19/2017  PT End of Session - 11/19/17 1231    Visit Number  6    Date for PT Re-Evaluation  12/15/17    Authorization Type  BCBS    PT Start Time  1231    PT Stop Time  1315    PT Time Calculation (min)  44 min    Activity Tolerance  Patient tolerated treatment well       Past Medical History:  Diagnosis Date  . Arthritis   . Colon polyps   . Depression   . Urinary incontinence     Past Surgical History:  Procedure Laterality Date  . ANKLE SURGERY    . BACK SURGERY  2013,2017  . CHOLECYSTECTOMY    . GASTRIC BYPASS      There were no vitals filed for this visit.  Subjective Assessment - 11/19/17 1312    Subjective  It is still sore when I reach back and overhead    Pertinent History  herniated discs in neck 2017 had injections;  2 back surgeries with fusion; neuropathy;  bil carpal tunnel surgery;  left RTC surgery     Patient Stated Goals  lessen pain in shoulder and elbow    Currently in Pain?  Yes    Pain Score  8     Pain Location  Shoulder    Pain Orientation  Right    Pain Descriptors / Indicators  Constant    Pain Type  Chronic pain    Pain Onset  More than a month ago    Pain Frequency  Constant    Multiple Pain Sites  No                       [facility] Adult PT Treatment/Exercise - 11/19/17 0001      Exercises   Exercises  Shoulder      Shoulder Exercises: Supine   Protraction  Strengthening;Right;15 reps   punching up to ceiling   External Rotation  AAROM;Right;15 reps    Flexion  Strengthening;Right;12 reps      Shoulder Exercises: Prone   Extension  Strengthening;Right;5 reps   discontinued due to increased  pain     Shoulder Exercises: Standing   External Rotation  Strengthening;10 reps;Theraband;Right    Theraband Level (Shoulder External Rotation)  Level 1 (Yellow)    Extension  Strengthening;Both;20 reps;Theraband    Theraband Level (Shoulder Extension)  Level 1 (Yellow)      Shoulder Exercises: ROM/Strengthening   Other ROM/Strengthening Exercises  UE ranger - standing flexion level 22 x 20; UE ranger sitting ER/IR 20x    Other ROM/Strengthening Exercises  finger ladder - flexion 8 x      Shoulder Exercises: Isometric Strengthening   Extension  Supine;5X5"      Moist Heat Therapy   Number Minutes Moist Heat  13 Minutes    Moist Heat Location  Shoulder      Electrical Stimulation   Electrical Stimulation Location  Rt shoulder    Electrical Stimulation Action  IFC    Electrical Stimulation Parameters  to tolerance x 13 min    Electrical Stimulation Goals  Pain  PT Short Term Goals - 11/19/17 1322      PT SHORT TERM GOAL #1   Title  The patient will demonstrate basic knowledge of pain control strategies and exercise to promote healing    Status  Achieved      PT SHORT TERM GOAL #2   Title  The patient will report a 30% improvement in right shoulder pain at night time and usual ADLs    Status  On-going      PT SHORT TERM GOAL #3   Title  The patient will have improved right shoulder flexion and abduction to 120 degrees needed for reaching    Status  On-going        PT Long Term Goals - 10/20/17 2215      PT LONG TERM GOAL #1   Title  The patient will be independent with HEP and appropriate gym program for right shoulder    Time  8    Period  Weeks    Status  New    Target Date  12/15/17      PT LONG TERM GOAL #2   Title  The patient will report a 60% reduction in shoulder pain at night time and with reaching     Time  8    Period  Weeks    Status  New      PT LONG TERM GOAL #3   Title  The patient will have improved right shoulder ROM with  flexion and abduction  to 130 degrees, external rotation to 55 degrees needed for grooming, dressing, reaching     Time  8    Period  Weeks    Status  New      PT LONG TERM GOAL #4   Title  Right shoulder strength to grossly 4-/5 needed for light lifting    Time  8    Period  Weeks    Status  New      PT LONG TERM GOAL #5   Title  FOTO functional outcome score improved from 52% limitation to 36% indicating improved function with less pain     Time  8    Period  Weeks    Status  New            Plan - 11/19/17 1316    Clinical Impression Statement  Pt did well today.  She needed modifications to exercises due to being limited by pain.  Pt was able to add new exercises to HEP and educated on taking a break when feeling popping in her shoulder.  Pt will benefit from skilled PT to progress shoulder stability and functional activities    PT Treatment/Interventions  ADLs/Self Care Home Management;Cryotherapy;Electrical Stimulation;Ultrasound;Moist Heat;Iontophoresis 4mg /ml Dexamethasone;Therapeutic exercise;Therapeutic activities;Neuromuscular re-education;Patient/family education;Dry needling;Taping    PT Next Visit Plan  check ROM for goals, gentle strength and ROM as tolerated, scapular mobility and stability, cervical posture, stim and heat for pain relief    PT Home Exercise Plan   Access Code: [email or web address]     Consulted and Agree with Plan of Care  Patient       Patient will benefit from skilled therapeutic intervention in order to improve the following deficits and impairments:  Pain, Decreased range of motion, Decreased strength, Impaired UE functional use  Visit Diagnosis: Acute pain of right shoulder  Stiffness of right shoulder, not elsewhere classified  Muscle weakness (generalized)     Problem List Patient Active Problem List   Diagnosis Date Noted  .  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 Bipolar affective disorder (HCC) 09/02/2017  . Chronic low back pain with  right-sided sciatica 09/02/2017  . PTSD (post-traumatic stress disorder) 09/02/2017  . Fibromyalgia 09/02/2017    Vincente Poli, PT 11/19/2017, 1:25 PM  [facility] [address] [place], [place], [address] Phone: [phone]   Fax:  [phone]  Name: Idamae Coccia MRN: [id number] Date of Birth: April 29, 1955

## 2017-11-24 ENCOUNTER — Ambulatory Visit: Payer: BLUE CROSS/BLUE SHIELD | Attending: Family Medicine | Admitting: Physical Therapy

## 2017-11-24 DIAGNOSIS — M25511 Pain in right shoulder: Secondary | ICD-10-CM

## 2017-11-24 DIAGNOSIS — M6281 Muscle weakness (generalized): Secondary | ICD-10-CM | POA: Diagnosis present

## 2017-11-24 DIAGNOSIS — M25611 Stiffness of right shoulder, not elsewhere classified: Secondary | ICD-10-CM

## 2017-11-24 NOTE — Therapy (Signed)
[facility] [address] [place], [place], [address] Phone: [phone]   Fax:  [phone]  Physical Therapy Treatment  Patient Details  Name: Tina Harrison MRN: [id number] Date of Birth: 05/06/1955 Referring Provider: Dr. Abbe Amsterdam   Encounter Date: 11/24/2017  PT End of Session - 11/24/17 1234    Visit Number  7    Date for PT Re-Evaluation  12/15/17    Authorization Type  BCBS    PT Start Time  1235    PT Stop Time  1315    PT Time Calculation (min)  40 min       Past Medical History:  Diagnosis Date  . Arthritis   . Colon polyps   . Depression   . Urinary incontinence     Past Surgical History:  Procedure Laterality Date  . ANKLE SURGERY    . BACK SURGERY  2013,2017  . CHOLECYSTECTOMY    . GASTRIC BYPASS      There were no vitals filed for this visit.  Subjective Assessment - 11/24/17 1236    Subjective  I am feeling okay    Patient Stated Goals  lessen pain in shoulder and elbow    Currently in Pain?  Yes    Pain Score  5                        [facility] Adult PT Treatment/Exercise - 11/24/17 0001      Shoulder Exercises: Supine   Horizontal ABduction  Strengthening;Right;20 reps   in 90 deg flexion - small ROM side to side   Flexion  Strengthening;Right;20 reps   in 90 deg flexion - small ROM up and down     Shoulder Exercises: Standing   External Rotation  Strengthening;10 reps;Theraband;Right    Theraband Level (Shoulder External Rotation)  Level 1 (Yellow)    Extension  Strengthening;Both;20 reps;Theraband    Theraband Level (Shoulder Extension)  Level 1 (Yellow)      Shoulder Exercises: ROM/Strengthening   Other ROM/Strengthening Exercises  UE ranger - standing flexion level 30 x 20; UE ranger sitting ER/IR 20x    Other ROM/Strengthening Exercises  finger ladder - flexion 10x      Moist Heat Therapy   Number Minutes Moist Heat  10 Minutes    Moist Heat Location   Shoulder      Electrical Stimulation   Electrical Stimulation Location  Rt shoulder    Electrical Stimulation Action  IFC    Electrical Stimulation Parameters  to tolerance    Electrical Stimulation Goals  Pain      Manual Therapy   Manual Therapy  Taping    Kinesiotex  Inhibit Muscle;Facilitate Muscle      Kinesiotix   Inhibit Muscle   upper trap and detoid    Facilitate Muscle   RTC supra and infraspinatus      shoulder abduciton - slide up the wall with towel         PT Short Term Goals - 11/19/17 1322      PT SHORT TERM GOAL #1   Title  The patient will demonstrate basic knowledge of pain control strategies and exercise to promote healing    Status  Achieved      PT SHORT TERM GOAL #2   Title  The patient will report a 30% improvement in right shoulder pain at night time and usual ADLs    Status  On-going  PT SHORT TERM GOAL #3   Title  The patient will have improved right shoulder flexion and abduction to 120 degrees needed for reaching    Status  On-going        PT Long Term Goals - 10/20/17 2215      PT LONG TERM GOAL #1   Title  The patient will be independent with HEP and appropriate gym program for right shoulder    Time  8    Period  Weeks    Status  New    Target Date  12/15/17      PT LONG TERM GOAL #2   Title  The patient will report a 60% reduction in shoulder pain at night time and with reaching     Time  8    Period  Weeks    Status  New      PT LONG TERM GOAL #3   Title  The patient will have improved right shoulder ROM with flexion and abduction  to 130 degrees, external rotation to 55 degrees needed for grooming, dressing, reaching     Time  8    Period  Weeks    Status  New      PT LONG TERM GOAL #4   Title  Right shoulder strength to grossly 4-/5 needed for light lifting    Time  8    Period  Weeks    Status  New      PT LONG TERM GOAL #5   Title  FOTO functional outcome score improved from 52% limitation to 36%  indicating improved function with less pain     Time  8    Period  Weeks    Status  New            Plan - 11/24/17 1357    Clinical Impression Statement  Pt was able to perform exercises without increased pain.  Pt needs cues with posture throughout.  Pt became fatigued with exercises and needed breaks in between.  Pt wil benefit from skilled PT to continue working on stabilty so she can return to maximum functional activities.    PT Treatment/Interventions  ADLs/Self Care Home Management;Cryotherapy;Electrical Stimulation;Ultrasound;Moist Heat;Iontophoresis 4mg /ml Dexamethasone;Therapeutic exercise;Therapeutic activities;Neuromuscular re-education;Patient/family education;Dry needling;Taping    PT Next Visit Plan  check ROM for goals, gentle strength and ROM as tolerated, scapular mobility and stability, cervical posture, stim and heat for pain relief    PT Home Exercise Plan   Access Code: [email or web address]     Consulted and Agree with Plan of Care  Patient       Patient will benefit from skilled therapeutic intervention in order to improve the following deficits and impairments:  Pain, Decreased range of motion, Decreased strength, Impaired UE functional use  Visit Diagnosis: Acute pain of right shoulder  Stiffness of right shoulder, not elsewhere classified  Muscle weakness (generalized)     Problem List Patient Active Problem List   Diagnosis Date Noted  .  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 Bipolar affective disorder (HCC) 09/02/2017  . 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 PTSD (post-traumatic stress disorder) 09/02/2017  . Fibromyalgia 09/02/2017    Vincente Poli, PT 11/24/2017, 5:26 PM  [facility] [address] [place], [place], [address] Phone: [phone]   Fax:  [phone]  Name: Tina Harrison MRN: [id number] Date of Birth: 06/06/1955

## 2017-11-26 ENCOUNTER — Encounter: Payer: BLUE CROSS/BLUE SHIELD | Admitting: Physical Therapy

## 2017-12-10 ENCOUNTER — Encounter: Payer: Self-pay | Admitting: Physical Therapy

## 2017-12-10 ENCOUNTER — Ambulatory Visit: Payer: BLUE CROSS/BLUE SHIELD | Admitting: Physical Therapy

## 2017-12-10 DIAGNOSIS — M25611 Stiffness of right shoulder, not elsewhere classified: Secondary | ICD-10-CM

## 2017-12-10 DIAGNOSIS — M25511 Pain in right shoulder: Secondary | ICD-10-CM

## 2017-12-10 DIAGNOSIS — M6281 Muscle weakness (generalized): Secondary | ICD-10-CM

## 2017-12-10 NOTE — Therapy (Signed)
[facility] [address] [place], [place], [address] Phone: [phone]   Fax:  [phone]  Physical Therapy Treatment  Patient Details  Name: Tina Harrison MRN: [id number] Date of Birth: 05/27/1955 Referring Provider: Dr. Abbe Amsterdam   Encounter Date: 12/10/2017  PT End of Session - 12/10/17 1229    Visit Number  8    Date for PT Re-Evaluation  12/15/17    Authorization Type  BCBS    PT Start Time  1227    PT Stop Time  1314    PT Time Calculation (min)  47 min    Activity Tolerance  Patient tolerated treatment well;Patient limited by pain       Past Medical History:  Diagnosis Date  . Arthritis   . Colon polyps   . Depression   . Urinary incontinence     Past Surgical History:  Procedure Laterality Date  . ANKLE SURGERY    . BACK SURGERY  2013,2017  . CHOLECYSTECTOMY    . GASTRIC BYPASS      There were no vitals filed for this visit.  Subjective Assessment - 12/10/17 1312    Subjective  It was good while I was away, but now that I am getting back into the swing of things, it's starting to hurt more.    Pertinent History  herniated discs in neck 2017 had injections;  2 back surgeries with fusion; neuropathy;  bil carpal tunnel surgery;  left RTC surgery     Limitations  House hold activities    Diagnostic tests  x-ray "tendonitis"    Patient Stated Goals  lessen pain in shoulder and elbow    Currently in Pain?  Yes    Pain Score  5          [facility] PT Assessment - 12/10/17 0001      Observation/Other Assessments   Focus on Therapeutic Outcomes (FOTO)   51% limited                   [facility] Adult PT Treatment/Exercise - 12/10/17 0001      Shoulder Exercises: Supine   Horizontal ABduction  Strengthening;Right;20 reps   in 90 deg flexion - small ROM side to side   Flexion  Strengthening;Right;20 reps   in 90 deg flexion - small ROM up and down   Other Supine Exercises  standing  wall slides scaption 10x; standing 90 deg side to side and up and down - 10x each      Shoulder Exercises: Standing   External Rotation  Strengthening;10 reps;Theraband;Right    Theraband Level (Shoulder External Rotation)  Level 1 (Yellow)    Extension  Strengthening;Both;20 reps;Theraband    Theraband Level (Shoulder Extension)  Level 1 (Yellow)      Shoulder Exercises: ROM/Strengthening   Other ROM/Strengthening Exercises  UE ranger - standing flexion level 25 20x add 1.5lb; 20x no weight; UE ranger sitting ER/IR 20x    Other ROM/Strengthening Exercises  finger ladder - flexion 10x   add 1.5#     Moist Heat Therapy   Number Minutes Moist Heat  10 Minutes    Moist Heat Location  Shoulder      Electrical Stimulation   Electrical Stimulation Location  Rt shoulder    Electrical Stimulation Action  IFC    Electrical Stimulation Parameters  to tolerance    Electrical Stimulation Goals  Pain      Manual Therapy   Manual Therapy  Taping  Kinesiotex  Inhibit Muscle;Facilitate Muscle      Kinesiotix   Inhibit Muscle   upper trap and detoid    Facilitate Muscle   RTC supra and infraspinatus               PT Short Term Goals - 11/19/17 1322      PT SHORT TERM GOAL #1   Title  The patient will demonstrate basic knowledge of pain control strategies and exercise to promote healing    Status  Achieved      PT SHORT TERM GOAL #2   Title  The patient will report a 30% improvement in right shoulder pain at night time and usual ADLs    Status  On-going      PT SHORT TERM GOAL #3   Title  The patient will have improved right shoulder flexion and abduction to 120 degrees needed for reaching    Status  On-going        PT Long Term Goals - 10/20/17 2215      PT LONG TERM GOAL #1   Title  The patient will be independent with HEP and appropriate gym program for right shoulder    Time  8    Period  Weeks    Status  New    Target Date  12/15/17      PT LONG TERM GOAL #2    Title  The patient will report a 60% reduction in shoulder pain at night time and with reaching     Time  8    Period  Weeks    Status  New      PT LONG TERM GOAL #3   Title  The patient will have improved right shoulder ROM with flexion and abduction  to 130 degrees, external rotation to 55 degrees needed for grooming, dressing, reaching     Time  8    Period  Weeks    Status  New      PT LONG TERM GOAL #4   Title  Right shoulder strength to grossly 4-/5 needed for light lifting    Time  8    Period  Weeks    Status  New      PT LONG TERM GOAL #5   Title  FOTO functional outcome score improved from 52% limitation to 36% indicating improved function with less pain     Time  8    Period  Weeks    Status  New            Plan - 12/10/17 1307    Clinical Impression Statement  Pt was able to add weight to exercises and is progressing very slowly.  Pt reports no change based on FOTO assessemnt  Pt reports the exercises helping a little but she is still having difficutly with lifting anything overhead.  Pt will benefit from skilled PT to address weakness.    PT Treatment/Interventions  ADLs/Self Care Home Management;Cryotherapy;Electrical Stimulation;Ultrasound;Moist Heat;Iontophoresis 4mg /ml Dexamethasone;Therapeutic exercise;Therapeutic activities;Neuromuscular re-education;Patient/family education;Dry needling;Taping    PT Next Visit Plan  ROM and strength re-assessed for goals, discussed going back to see doctor if she continues to have pain and weakness    PT Home Exercise Plan   Access Code: [email or web address]     Consulted and Agree with Plan of Care  Patient       Patient will benefit from skilled therapeutic intervention in order to improve the following deficits and impairments:  Pain, Decreased range of motion,  Decreased strength, Impaired UE functional use  Visit Diagnosis: Acute pain of right shoulder  Stiffness of right shoulder, not elsewhere classified  Muscle weakness  (generalized)     Problem List Patient Active Problem List   Diagnosis Date Noted  .  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 Bipolar affective disorder (HCC) 09/02/2017  . 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 PTSD (post-traumatic stress disorder) 09/02/2017  . Fibromyalgia 09/02/2017    Vincente Poli, PT 12/10/2017, 1:14 PM  [facility] [address], [address] [place], [place], [address] Phone: [phone]   Fax:  [phone]  Name: Tina Harrison MRN: [id number] Date of Birth: 1956-02-01

## 2017-12-15 ENCOUNTER — Encounter: Payer: Self-pay | Admitting: Physical Therapy

## 2017-12-15 ENCOUNTER — Ambulatory Visit: Payer: BLUE CROSS/BLUE SHIELD | Admitting: Physical Therapy

## 2017-12-15 DIAGNOSIS — M25511 Pain in right shoulder: Secondary | ICD-10-CM

## 2017-12-15 DIAGNOSIS — M25611 Stiffness of right shoulder, not elsewhere classified: Secondary | ICD-10-CM

## 2017-12-15 DIAGNOSIS — M6281 Muscle weakness (generalized): Secondary | ICD-10-CM

## 2017-12-15 NOTE — Patient Instructions (Signed)
Access Code: [email or web address]  URL: [email or web address]  Date: 12/15/2017  Prepared by: Dorie RankJacqueline Crosser   Exercises  Isometric Shoulder Flexion at Wall - 10 reps - 1 sets - 5 sec hold - 1x daily - 7x weekly  Isometric Shoulder Extension at Wall - 10 reps - 1 sets - 5 sec hold - 1x daily - 7x weekly  Isometric Shoulder External Rotation at Wall - 10 reps - 1 sets - 5 sec hold - 1x daily - 7x weekly  Standing Isometric Shoulder Internal Rotation at Doorway - 10 reps - 1 sets - 5 sec hold - 1x daily - 7x weekly  Isometric Shoulder Abduction at Wall - 10 reps - 1 sets - 5 sec hold - 1x daily - 7x weekly  Standing Shoulder Flexion AAROM with Dowel - 10 reps - 3 sets - 1x daily - 7x weekly  Supine Head Nod Deep Neck Flexor Training - 10 reps - 1 sets - 5 sec hold - 2x daily - 7x weekly  Seated Scapular Retraction - 10 reps - 1 sets - 5 sec hold - 3x daily - 7x weekly  Supine Alternating Arm Punches on Foam Roll - 10 reps - 3 sets - 1x daily - 7x weekly  Dead Bug Alternating Arm Extension - 10 reps - 3 sets - 1x daily - 7x weekly  Standing Shoulder Flexion to 90 Degrees with Dumbbells - 10 reps - 2 sets - 1x daily - 7x weekly

## 2017-12-15 NOTE — Therapy (Signed)
[facility] [address] [place], [place], [address] Phone: [phone]   Fax:  [phone]  Physical Therapy Treatment  Patient Details  Name: Tina Harrison MRN: [id number] Date of Birth: 10-Apr-1955 Referring Provider: Dr. Grier Mitts   Encounter Date: 12/15/2017  PT End of Session - 12/15/17 1231    Visit Number  9    Date for PT Re-Evaluation  12/15/17    Authorization Type  BCBS    PT Start Time  1231    PT Stop Time  1315    PT Time Calculation (min)  44 min    Activity Tolerance  Patient tolerated treatment well;Patient limited by pain       Past Medical History:  Diagnosis Date  . Arthritis   . Colon polyps   . Depression   . Urinary incontinence     Past Surgical History:  Procedure Laterality Date  . ANKLE SURGERY    . BACK SURGERY  2013,2017  . CHOLECYSTECTOMY    . GASTRIC BYPASS      There were no vitals filed for this visit.  Subjective Assessment - 12/15/17 1232    Subjective  It has it's days, it's not constant like when I started.  At night it's the worst.  Last night was a bad night.  I feel 60% improved since starting physical therapy.    Pertinent History  herniated discs in neck 2017 had injections;  2 back surgeries with fusion; neuropathy;  bil carpal tunnel surgery;  left RTC surgery     Limitations  House hold activities    Patient Stated Goals  lessen pain in shoulder and elbow    Currently in Pain?  Yes    Pain Score  8     Pain Location  Shoulder    Pain Orientation  Right         [facility] PT Assessment - 12/15/17 0001      AROM   Right Shoulder Flexion  139 Degrees    Right Shoulder ABduction  115 Degrees      Strength   Right Shoulder Flexion  4-/5    Right Shoulder ABduction  4-/5    Right Shoulder Internal Rotation  4+/5    Right Shoulder External Rotation  4-/5                   [facility] Adult PT Treatment/Exercise - 12/15/17 0001      Self-Care    Self-Care  Other Self-Care Comments    Other Self-Care Comments   tennis ball massage to upper trap      Exercises   Exercises  Shoulder      Shoulder Exercises: Standing   External Rotation  Strengthening;10 reps;Theraband;Right    Theraband Level (Shoulder External Rotation)  Level 1 (Yellow)    Flexion  Strengthening;Right;Left;10 reps;Weights   to shoulder height   Shoulder Flexion Weight (lbs)  1    Extension  Strengthening;Both;20 reps;Theraband    Theraband Level (Shoulder Extension)  Level 1 (Yellow)      Shoulder Exercises: ROM/Strengthening   Other ROM/Strengthening Exercises  UE ranger - standing flexion level 25 20x add 1.5lb; 20x no weight; UE ranger sitting ER/IR 20x      Moist Heat Therapy   Number Minutes Moist Heat  13 Minutes    Moist Heat Location  Shoulder      Electrical Stimulation   Electrical Stimulation Location  Rt shoulder    Electrical Stimulation  Action  IFC    Electrical Stimulation Parameters  to tolerance    Electrical Stimulation Goals  Pain             PT Education - 12/15/17 1307    Education Details   Access Code: [email or web address]     Person(s) Educated  Patient    Methods  Explanation;Demonstration;Handout;Verbal cues    Comprehension  Verbalized understanding;Returned demonstration       PT Short Term Goals - 12/15/17 1235      PT SHORT TERM GOAL #2   Title  The patient will report a 30% improvement in right shoulder pain at night time and usual ADLs    Status  Achieved      PT SHORT TERM GOAL #3   Title  The patient will have improved right shoulder flexion and abduction to 120 degrees needed for reaching    Baseline  abduction 115    Status  Partially Met        PT Long Term Goals - 12/15/17 1235      PT LONG TERM GOAL #1   Title  The patient will be independent with HEP and appropriate gym program for right shoulder    Status  Achieved      PT LONG TERM GOAL #2   Title  The patient will report a 60% reduction in  shoulder pain at night time and with reaching     Baseline  60% improved    Status  Achieved      PT LONG TERM GOAL #3   Title  The patient will have improved right shoulder ROM with flexion and abduction  to 130 degrees, external rotation to 55 degrees needed for grooming, dressing, reaching     Status  Not Met      PT LONG TERM GOAL #4   Title  Right shoulder strength to grossly 4-/5 needed for light lifting    Baseline  4-/5    Status  Achieved      PT LONG TERM GOAL #5   Title  FOTO functional outcome score improved from 52% limitation to 36% indicating improved function with less pain     Baseline  44% limited from 52%    Status  Partially Met            Plan - 12/15/17 1307    Clinical Impression Statement  Pt has made good progress with PT but it has been slow.  Pt continues to get flare ups and is concerned about something being injured . Pt appears to have lingering RTC weakness that may be from tear due to difficulty advancing strengthening exercises at this time.  She demonstrates a lot of improvement in AROM and MMT but overhead activities remain almost as limited as prior to starting PT . Pt will be discharged with HEP at this time and will see if she can get further imaging done on shoulder.     PT Treatment/Interventions  ADLs/Self Care Home Management;Cryotherapy;Electrical Stimulation;Ultrasound;Moist Heat;Iontophoresis 73m/ml Dexamethasone;Therapeutic exercise;Therapeutic activities;Neuromuscular re-education;Patient/family education;Dry needling;Taping    PT Next Visit Plan  dishcarged today    PT Home Exercise Plan   Access Code: [email or web address]    Recommended Other Services  order signed    Consulted and Agree with Plan of Care  Patient       Patient will benefit from skilled therapeutic intervention in order to improve the following deficits and impairments:  Pain, Decreased range of motion, Decreased strength, Impaired UE functional  use  Visit Diagnosis: Acute  pain of right shoulder  Stiffness of right shoulder, not elsewhere classified  Muscle weakness (generalized)     Problem List Patient Active Problem List   Diagnosis Date Noted  .  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 Bipolar affective disorder ([place]) 09/02/2017  . 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 PTSD (post-traumatic stress disorder) 09/02/2017  . Fibromyalgia 09/02/2017    Zannie Cove , PT 12/15/2017, 1:20 PM  [facility] [address], [place], [place], [address] Phone: [phone]   Fax:  [phone]  Name: Tina Harrison MRN: [id number] Date of Birth: 1956-02-07  PHYSICAL THERAPY DISCHARGE SUMMARY  Visits from Start of Care: 9  Current functional level related to goals / functional outcomes: See above   Remaining deficits: See above   Education / Equipment: HEP  Plan: Patient agrees to discharge.  Patient goals were partially met. Patient is being discharged due to the patient's request.  ?????    Zannie Cove, PT 12/15/17 1:20 PM

## 2018-10-29 ENCOUNTER — Telehealth: Payer: Self-pay | Admitting: Family Medicine

## 2018-10-29 NOTE — Telephone Encounter (Signed)
Tried to call but had to leave message for pt to call back

## 2018-10-29 NOTE — Telephone Encounter (Signed)
Copied from [place] [phone]. Topic: Appointment Scheduling - Scheduling Inquiry for Clinic >> Oct 29, 2018 10:42 AM Rayann Heman wrote: Reason for CRM: pt called and stated that she would like to schedule an appointment for a physical. Please advise

## 2018-12-15 ENCOUNTER — Other Ambulatory Visit: Payer: Self-pay

## 2018-12-15 ENCOUNTER — Encounter: Payer: Self-pay | Admitting: Family Medicine

## 2018-12-15 ENCOUNTER — Ambulatory Visit (INDEPENDENT_AMBULATORY_CARE_PROVIDER_SITE_OTHER): Payer: BC Managed Care – PPO | Admitting: Family Medicine

## 2018-12-15 VITALS — BP 128/82 | HR 96 | Temp 98.5°F | Wt 241.0 lb

## 2018-12-15 DIAGNOSIS — Z13220 Encounter for screening for lipoid disorders: Secondary | ICD-10-CM | POA: Diagnosis not present

## 2018-12-15 DIAGNOSIS — Z0000 Encounter for general adult medical examination without abnormal findings: Secondary | ICD-10-CM | POA: Diagnosis not present

## 2018-12-15 DIAGNOSIS — L720 Epidermal cyst: Secondary | ICD-10-CM | POA: Diagnosis not present

## 2018-12-15 DIAGNOSIS — Z131 Encounter for screening for diabetes mellitus: Secondary | ICD-10-CM | POA: Diagnosis not present

## 2018-12-15 DIAGNOSIS — Z23 Encounter for immunization: Secondary | ICD-10-CM | POA: Diagnosis not present

## 2018-12-15 NOTE — Patient Instructions (Signed)
Preventive Care 40-64 Years Old, Female Preventive care refers to visits with your health care provider and lifestyle choices that can promote health and wellness. This includes:  A yearly physical exam. This may also be called an annual well check.  Regular dental visits and eye exams.  Immunizations.  Screening for certain conditions.  Healthy lifestyle choices, such as eating a healthy diet, getting regular exercise, not using drugs or products that contain nicotine and tobacco, and limiting alcohol use. What can I expect for my preventive care visit? Physical exam Your health care provider will check your:  Height and weight. This may be used to calculate body mass index (BMI), which tells if you are at a healthy weight.  Heart rate and blood pressure.  Skin for abnormal spots. Counseling Your health care provider may ask you questions about your:  Alcohol, tobacco, and drug use.  Emotional well-being.  Home and relationship well-being.  Sexual activity.  Eating habits.  Work and work environment.  Method of birth control.  Menstrual cycle.  Pregnancy history. What immunizations do I need?  Influenza (flu) vaccine  This is recommended every year. Tetanus, diphtheria, and pertussis (Tdap) vaccine  You may need a Td booster every 10 years. Varicella (chickenpox) vaccine  You may need this if you have not been vaccinated. Zoster (shingles) vaccine  You may need this after age 60. Measles, mumps, and rubella (MMR) vaccine  You may need at least one dose of MMR if you were born in 1957 or later. You may also need a second dose. Pneumococcal conjugate (PCV13) vaccine  You may need this if you have certain conditions and were not previously vaccinated. Pneumococcal polysaccharide (PPSV23) vaccine  You may need one or two doses if you smoke cigarettes or if you have certain conditions. Meningococcal conjugate (MenACWY) vaccine  You may need this if you  have certain conditions. Hepatitis A vaccine  You may need this if you have certain conditions or if you travel or work in places where you may be exposed to hepatitis A. Hepatitis B vaccine  You may need this if you have certain conditions or if you travel or work in places where you may be exposed to hepatitis B. Haemophilus influenzae type b (Hib) vaccine  You may need this if you have certain conditions. Human papillomavirus (HPV) vaccine  If recommended by your health care provider, you may need three doses over 6 months. You may receive vaccines as individual doses or as more than one vaccine together in one shot (combination vaccines). Talk with your health care provider about the risks and benefits of combination vaccines. What tests do I need? Blood tests  Lipid and cholesterol levels. These may be checked every 5 years, or more frequently if you are over 50 years old.  Hepatitis C test.  Hepatitis B test. Screening  Lung cancer screening. You may have this screening every year starting at age 55 if you have a 30-pack-year history of smoking and currently smoke or have quit within the past 15 years.  Colorectal cancer screening. All adults should have this screening starting at age 50 and continuing until age 75. Your health care provider may recommend screening at age 45 if you are at increased risk. You will have tests every 1-10 years, depending on your results and the type of screening test.  Diabetes screening. This is done by checking your blood sugar (glucose) after you have not eaten for a while (fasting). You may have this   done every 1-3 years.  Mammogram. This may be done every 1-2 years. Talk with your health care provider about when you should start having regular mammograms. This may depend on whether you have a family history of breast cancer.  BRCA-related cancer screening. This may be done if you have a family history of breast, ovarian, tubal, or peritoneal  cancers.  Pelvic exam and Pap test. This may be done every 3 years starting at age 48. Starting at age 89, this may be done every 5 years if you have a Pap test in combination with an HPV test. Other tests  Sexually transmitted disease (STD) testing.  Bone density scan. This is done to screen for osteoporosis. You may have this scan if you are at high risk for osteoporosis. Follow these instructions at home: Eating and drinking  Eat a diet that includes fresh fruits and vegetables, whole grains, lean protein, and low-fat dairy.  Take vitamin and mineral supplements as recommended by your health care provider.  Do not drink alcohol if: ? Your health care provider tells you not to drink. ? You are pregnant, may be pregnant, or are planning to become pregnant.  If you drink alcohol: ? Limit how much you have to 0-1 drink a day. ? Be aware of how much alcohol is in your drink. In the [place] one drink equals one 12 oz bottle of beer (355 mL), one 5 oz glass of wine (148 mL), or one 1 oz glass of hard liquor (44 mL). Lifestyle  Take daily care of your teeth and gums.  Stay active. Exercise for at least 30 minutes on 5 or more days each week.  Do not use any products that contain nicotine or tobacco, such as cigarettes, e-cigarettes, and chewing tobacco. If you need help quitting, ask your health care provider.  If you are sexually active, practice safe sex. Use a condom or other form of birth control (contraception) in order to prevent pregnancy and STIs (sexually transmitted infections).  If told by your health care provider, take low-dose aspirin daily starting at age 74. What's next?  Visit your health care provider once a year for a well check visit.  Ask your health care provider how often you should have your eyes and teeth checked.  Stay up to date on all vaccines. This information is not intended to replace advice given to you by your health care provider. Make sure you  discuss any questions you have with your health care provider. Document Released: 04/06/2015 Document Revised: 11/19/2017 Document Reviewed: 11/19/2017 Elsevier Patient Education  2020 [place].  Epidermal Cyst  An epidermal cyst is a sac made of skin tissue. The sac contains a substance called keratin. Keratin is a protein that is normally secreted through the hair follicles. When keratin becomes trapped in the top layer of skin (epidermis), it can form an epidermal cyst. Epidermal cysts can be found anywhere on your body. These cysts are usually harmless (benign), and they may not cause symptoms unless they become infected. What are the causes? This condition may be caused by:  A blocked hair follicle.  A hair that curls and re-enters the skin instead of growing straight out of the skin (ingrown hair).  A blocked pore.  Irritated skin.  An injury to the skin.  Certain conditions that are passed along from parent to child (inherited).  Human papillomavirus (HPV).  Long-term (chronic) sun damage to the skin. What increases the risk? The following factors may make  you more likely to develop an epidermal cyst:  Having acne.  Being overweight.  Being 5-84 years old. What are the signs or symptoms? The only symptom of this condition may be a small, painless lump underneath the skin. When an epidermal cyst ruptures, it may become infected. Symptoms may include:  Redness.  Inflammation.  Tenderness.  Warmth.  Fever.  Keratin draining from the cyst. Keratin is grayish-white, bad-smelling substance.  Pus draining from the cyst. How is this diagnosed? This condition is diagnosed with a physical exam.  In some cases, you may have a sample of tissue (biopsy) taken from your cyst to be examined under a microscope or tested for bacteria.  You may be referred to a health care provider who specializes in skin care (dermatologist). How is this treated? In many cases,  epidermal cysts go away on their own without treatment. If a cyst becomes infected, treatment may include:  Opening and draining the cyst, done by a health care provider. After draining, minor surgery to remove the rest of the cyst may be done.  Antibiotic medicine.  Injections of medicines (steroids) that help to reduce inflammation.  Surgery to remove the cyst. Surgery may be done if the cyst: ? Becomes large. ? Bothers you. ? Has a chance of turning into cancer.  Do not try to open a cyst yourself. Follow these instructions at home:  Take over-the-counter and prescription medicines only as told by your health care provider.  If you were prescribed an antibiotic medicine, take it it as told by your health care provider. Do not stop using the antibiotic even if you start to feel better.  Keep the area around your cyst clean and dry.  Wear loose, dry clothing.  Avoid touching your cyst.  Check your cyst every day for signs of infection. Check for: ? Redness, swelling, or pain. ? Fluid or blood. ? Warmth. ? Pus or a bad smell.  Keep all follow-up visits as told by your health care provider. This is important. How is this prevented?  Wear clean, dry, clothing.  Avoid wearing tight clothing.  Keep your skin clean and dry. Take showers or baths every day. Contact a health care provider if:  Your cyst develops symptoms of infection.  Your condition is not improving or is getting worse.  You develop a cyst that looks different from other cysts you have had.  You have a fever. Get help right away if:  Redness spreads from the cyst into the surrounding area. Summary  An epidermal cyst is a sac made of skin tissue. These cysts are usually harmless (benign), and they may not cause symptoms unless they become infected.  If a cyst becomes infected, treatment may include surgery to open and drain the cyst, or to remove it. Treatment may also include medicines by mouth or  through an injection.  Take over-the-counter and prescription medicines only as told by your health care provider. If you were prescribed an antibiotic medicine, take it as told by your health care provider. Do not stop using the antibiotic even if you start to feel better.  Contact a health care provider if your condition is not improving or is getting worse.  Keep all follow-up visits as told by your health care provider. This is important. This information is not intended to replace advice given to you by your health care provider. Make sure you discuss any questions you have with your health care provider. Document Released: 02/09/2004 Document Revised: 07/01/2018  Document Reviewed: 09/21/2017 Elsevier Patient Education  [place].  Epidermal Cyst Removal, Care After This sheet gives you information about how to care for yourself after your procedure. Your health care provider may also give you more specific instructions. If you have problems or questions, contact your health care provider. What can I expect after the procedure? After the procedure, it is common to have:  Soreness in the area where your cyst was removed.  Tightness or itchiness from the stitches (sutures) in your skin. Follow these instructions at home: Medicines  Take over-the-counter and prescription medicines only as told by your health care provider.  If you were prescribed an antibiotic medicine or ointment, take or apply it as told by your health care provider. Do not stop using the antibiotic even if you start to feel better. Incision care   Follow instructions from your health care provider about how to take care of your incision. Make sure you: ? Wash your hands with soap and water before you change your bandage (dressing). If soap and water are not available, use hand sanitizer. ? Change your dressing as told by your health care provider. ? Leave sutures, skin glue, or adhesive strips in place.  These skin closures may need to stay in place for 1-2 weeks or longer. If adhesive strip edges start to loosen and curl up, you may trim the loose edges. Do not remove adhesive strips completely unless your health care provider tells you to do that.  Keep the dressingdry until your health care provider says that it can be removed.  After your dressing is off, check your incision area every day for signs of infection. Check for: ? Redness, swelling, or pain. ? Fluid or blood. ? Warmth. ? Pus or a bad smell. General instructions  Do not take baths, swim, or use a hot tub until your health care provider approves. Ask your health care provider if you may take showers. You may only be allowed to take sponge baths.  Your health care provider may ask you to avoid contact sports or activities that take a lot of effort. Do not do anything that stretches or puts pressure on your incision.  You can return to your normal diet.  Keep all follow-up visits as told by your health care provider. This is important. Contact a health care provider if:  You have a fever.  You have redness, swelling, or pain in the incision area.  You have fluid or blood coming from your incision.  You have pus or a bad smell coming from your incision.  Your incision feels warm to the touch.  Your cyst grows back. Summary  After the procedure, it is common to have soreness in the area where your cyst was removed.  Take or apply over-the-counter and prescription medicines only as told by your health care provider.  Follow instructions from your health care provider about how to take care of your incision. This information is not intended to replace advice given to you by your health care provider. Make sure you discuss any questions you have with your health care provider. Document Released: 03/31/2014 Document Revised: 06/30/2017 Document Reviewed: 01/01/2017 Elsevier Patient Education  2020 [employer].

## 2018-12-15 NOTE — Progress Notes (Signed)
Subjective:     Tina Harrison is a 63 y.o. female and is here for a comprehensive physical exam. The patient reports problems - bump under R axilla.  Bump present x "a while", at least 1 yr.  Started as a black head. Bra can rub against area causing irritation.  Otherwise pt is doing well.  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  Social History   Socioeconomic History  . Marital status: Divorced    Spouse name: Not on file  . Number of children: Not on file  . Years of education: Not on file  . Highest education level: Not on file  Occupational History  . Not on file  Social Needs  . Financial resource strain: Not on file  . Food insecurity    Worry: Not on file    Inability: Not on file  . Transportation needs    Medical: Not on file    Non-medical: Not on file  Tobacco Use  . Smoking status: Former Research scientist (life sciences)  . Smokeless tobacco: Former Network engineer and Sexual Activity  . Alcohol use: Not Currently  . Drug use: Not Currently  . Sexual activity: Not on file  Lifestyle  . Physical activity    Days per week: Not on file    Minutes per session: Not on file  . Stress: Not on file  Relationships  . Social Herbalist on phone: Not on file    Gets together: Not on file    Attends religious service: Not on file    Active member of club or organization: Not on file    Attends meetings of clubs or organizations: Not on file    Relationship status: Not on file  . Intimate partner violence    Fear of current or ex partner: Not on file    Emotionally abused: Not on file    Physically abused: Not on file    Forced sexual activity: Not on file  Other Topics Concern  . Not on file  Social History Narrative  . Not on file   Health Maintenance  Topic Date Due  . Hepatitis C Screening  1955-09-05  . HIV Screening  10/06/1970  . PAP SMEAR-Modifier  10/05/1976  . MAMMOGRAM  10/05/2005  . COLONOSCOPY  08/22/2026  . TETANUS/TDAP  12/14/2028  .  INFLUENZA VACCINE  Completed    The following portions of the patient's history were reviewed and updated as appropriate: allergies, current medications, past family history, past medical history, past social history, past surgical history and problem list.  Review of Systems Pertinent items noted in HPI and remainder of comprehensive ROS otherwise negative.   Objective:    BP 128/82 (BP Location: Left Arm, Patient Position: Sitting, Cuff Size: Large)   Pulse 96   Temp 98.5 F (36.9 C) (Oral)   Wt 241 lb (109.3 kg)   SpO2 97%   BMI 41.37 kg/m  General appearance: alert, cooperative and no distress Head: Normocephalic, without obvious abnormality, atraumatic Eyes: conjunctivae/corneas clear. PERRL, EOM's intact. Fundi benign. Ears: normal TM's and external ear canals both ears Nose: Nares normal. Septum midline. Mucosa normal. No drainage or sinus tenderness. Throat: lips, mucosa, and tongue normal; teeth and gums normal Neck: no adenopathy, no carotid bruit, no JVD, supple, symmetrical, trachea midline and thyroid not enlarged, symmetric, no tenderness/mass/nodules Lungs: clear to auscultation bilaterally Heart: regular rate and rhythm, S1, S2 normal, no murmur, click, rub or gallop Abdomen: soft,  non-tender; bowel sounds normal; no masses,  no organomegaly Extremities: extremities normal, atraumatic, no cyanosis or edema Pulses: 2+ and symmetric Skin: Skin color, texture, turgor normal. No rashes or lesions.  R axilla with 2.5 cm firm cyst with open comedone and induration, no drainage or erythema Lymph nodes: Cervical, supraclavicular, and axillary nodes normal. Neurologic: Alert and oriented X 3, normal strength and tone. Normal symmetric reflexes. Normal coordination and gait    Assessment:    Healthy female exam with cyst in R axilla.      Plan:     Anticipatory guidance given including wearing seatbelts, smoke detectors in the home, increasing physical activity,  increasing p.o. intake of water and vegetables. -will obtain labs -tdap given this visit -pt to schedule mammogram -next CPE in 1 yr See After Visit Summary for Counseling Recommendations    Need for diphtheria-tetanus-pertussis (Tdap) vaccine  - Plan: Tdap vaccine greater than or equal to 7yo IM  Screening for cholesterol level  - Plan: Lipid panel  Screening for diabetes mellitus  - Plan: Hemoglobin A1c  Epidermal cyst -consent obtained.  I&D performed.  Pt tolerated procedure well -see procedure note below -given handouts -given precautions  Incision and Drainage Procedure Note  Pre-operative Diagnosis: cyst  Post-operative Diagnosis: epidermal cyst  Anesthesia: 1% plain lidocaine  Procedure Details  The procedure, risks and complications have been discussed in detail (including, but not limited to airway compromise, infection, bleeding) with the patient, and the patient has signed consent to the procedure.  The skin was sterilely prepped and draped over the affected area in the usual fashion. After adequate local anesthesia, I&D with a #11 blade was performed on the right axilla. Purulent drainage: present The patient was observed until stable.  EBL: minimal cc's  Condition: Tolerated procedure well  Complications: none.   F/u prn  Abbe Amsterdam, MD

## 2018-12-16 LAB — BASIC METABOLIC PANEL
BUN: 16 mg/dL (ref 6–23)
CO2: 27 mEq/L (ref 19–32)
Calcium: 9.7 mg/dL (ref 8.4–10.5)
Chloride: 104 mEq/L (ref 96–112)
Creatinine, Ser: 0.68 mg/dL (ref 0.40–1.20)
GFR: 87.33 mL/min (ref >60.00)
Glucose, Bld: 90 mg/dL (ref 70–99)
Potassium: 4.2 mEq/L (ref 3.5–5.1)
Sodium: 139 mEq/L (ref 135–145)

## 2018-12-16 LAB — LIPID PANEL
Cholesterol: 159 mg/dL (ref 0–200)
HDL: 81.6 mg/dL (ref >39.00)
LDL Cholesterol: 63 mg/dL (ref 0–99)
NonHDL: 77.41
Total CHOL/HDL Ratio: 2
Triglycerides: 71 mg/dL (ref 0.0–149.0)
VLDL: 14.2 mg/dL (ref 0.0–40.0)

## 2018-12-16 LAB — CBC WITH DIFFERENTIAL/PLATELET
Basophils Absolute: 0 10*3/uL (ref 0.0–0.1)
Basophils Relative: 0.5 % (ref 0.0–3.0)
Eosinophils Absolute: 0.1 10*3/uL (ref 0.0–0.7)
Eosinophils Relative: 1.1 % (ref 0.0–5.0)
HCT: 39.6 % (ref 36.0–46.0)
Hemoglobin: 13.2 g/dL (ref 12.0–15.0)
Lymphocytes Relative: 25 % (ref 12.0–46.0)
Lymphs Abs: 2 10*3/uL (ref 0.7–4.0)
MCHC: 33.4 g/dL (ref 30.0–36.0)
MCV: 91.6 fl (ref 78.0–100.0)
Monocytes Absolute: 0.5 10*3/uL (ref 0.1–1.0)
Monocytes Relative: 6.4 % (ref 3.0–12.0)
Neutro Abs: 5.3 10*3/uL (ref 1.4–7.7)
Neutrophils Relative %: 67 % (ref 43.0–77.0)
Platelets: 293 10*3/uL (ref 150.0–400.0)
RBC: 4.33 Mil/uL (ref 3.87–5.11)
RDW: 14.2 % (ref 11.5–15.5)
WBC: 8 10*3/uL (ref 4.0–10.5)

## 2018-12-16 LAB — HEMOGLOBIN A1C: Hgb A1c MFr Bld: 5.8 % (ref 4.6–6.5)

## 2018-12-19 ENCOUNTER — Encounter: Payer: Self-pay | Admitting: Family Medicine

## 2019-01-28 ENCOUNTER — Telehealth: Payer: Self-pay

## 2019-01-28 NOTE — Telephone Encounter (Signed)
Please advise 

## 2019-01-28 NOTE — Telephone Encounter (Signed)
Copied from [place] [phone]. Topic: General - Inquiry >> Jan 28, 2019  3:48 PM Mathis Bud wrote: Reason for CRM: Patient is requesting PCP to call her in something for her dry cough.  Patient is waiting on covid test results. Pharmacy [facility] [place], [place] [facility] [phone] (Phone) [phone] (Fax)

## 2019-01-29 ENCOUNTER — Ambulatory Visit (HOSPITAL_COMMUNITY)
Admission: EM | Admit: 2019-01-29 | Discharge: 2019-01-29 | Disposition: A | Discharge status: Home / Self Care | Payer: BC Managed Care – PPO | Attending: Physician Assistant | Admitting: Physician Assistant

## 2019-01-29 ENCOUNTER — Encounter (HOSPITAL_COMMUNITY): Payer: Self-pay

## 2019-01-29 ENCOUNTER — Other Ambulatory Visit: Payer: Self-pay

## 2019-01-29 DIAGNOSIS — J988 Other specified respiratory disorders: Secondary | ICD-10-CM | POA: Diagnosis not present

## 2019-01-29 DIAGNOSIS — B9789 Other viral agents as the cause of diseases classified elsewhere: Secondary | ICD-10-CM

## 2019-01-29 DIAGNOSIS — M1990 Unspecified osteoarthritis, unspecified site: Secondary | ICD-10-CM | POA: Insufficient documentation

## 2019-01-29 DIAGNOSIS — U071 COVID-19: Secondary | ICD-10-CM | POA: Diagnosis not present

## 2019-01-29 DIAGNOSIS — Z87891 Personal history of nicotine dependence: Secondary | ICD-10-CM | POA: Insufficient documentation

## 2019-01-29 DIAGNOSIS — Z79899 Other long term (current) drug therapy: Secondary | ICD-10-CM | POA: Insufficient documentation

## 2019-01-29 DIAGNOSIS — M797 Fibromyalgia: Secondary | ICD-10-CM | POA: Diagnosis not present

## 2019-01-29 DIAGNOSIS — Z9884 Bariatric surgery status: Secondary | ICD-10-CM | POA: Insufficient documentation

## 2019-01-29 DIAGNOSIS — F4310 Post-traumatic stress disorder, unspecified: Secondary | ICD-10-CM | POA: Insufficient documentation

## 2019-01-29 DIAGNOSIS — R05 Cough: Secondary | ICD-10-CM

## 2019-01-29 DIAGNOSIS — Z9049 Acquired absence of other specified parts of digestive tract: Secondary | ICD-10-CM | POA: Diagnosis not present

## 2019-01-29 DIAGNOSIS — Z885 Allergy status to narcotic agent status: Secondary | ICD-10-CM | POA: Diagnosis not present

## 2019-01-29 DIAGNOSIS — F319 Bipolar disorder, unspecified: Secondary | ICD-10-CM | POA: Insufficient documentation

## 2019-01-29 DIAGNOSIS — R059 Cough, unspecified: Secondary | ICD-10-CM

## 2019-01-29 MED ORDER — BENZONATATE 100 MG PO CAPS: 100.0000 mg | ORAL_CAPSULE | Freq: Three times a day (TID) | ORAL | 0 refills | Status: DC

## 2019-01-29 NOTE — Discharge Instructions (Addendum)
Screen for Covid. Self isolate until results are back. Otherwise a viral infection. Treat symptomatically with rest, fluids. IF worsens f/u with your PCP or if emergent the ER.

## 2019-01-29 NOTE — ED Provider Notes (Signed)
[facility]    CSN: [id number] Arrival date & time: 01/29/19  1218      History   Chief Complaint Chief Complaint  Patient presents with  . Cough  . Chest Pain  . Shortness of Breath  . Diarrhea  . Generalized Body Aches  . Headache    HPI Tina Harrison is a 63 y.o. female.   Presents with a 2 day history of acute dry cough, malaise, myalgia, diarrhea and feelings of dyspnea. She notes pain in her chest only when coughs. No known exposures. No fevers. She did have a covid test screening 6 days ago without known results. She was asymptomatic at that time.      Past Medical History:  Diagnosis Date  . Arthritis   . Colon polyps   . Depression   . Urinary incontinence     Patient Active Problem List   Diagnosis Date Noted  .  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 Bipolar affective disorder (HCC) 09/02/2017  . 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 PTSD (post-traumatic stress disorder) 09/02/2017  . Fibromyalgia 09/02/2017    Past Surgical History:  Procedure Laterality Date  . ANKLE SURGERY    . BACK SURGERY  2013,2017  . CHOLECYSTECTOMY    . GASTRIC BYPASS      OB History   No obstetric history on file.      Home Medications    Prior to Admission medications   Medication Sig Start Date End Date Taking? Authorizing Provider  clonazePAM (KLONOPIN) 2 MG tablet Take 2 mg by mouth as needed for anxiety.    [provider]  mirtazapine (REMERON) 45 MG tablet Take 45 mg by mouth at bedtime.    [provider]  nortriptyline (PAMELOR) 50 MG capsule Take 50 mg by mouth at bedtime. Take 1 to 11/2 tablet at bedtime    [provider]  prazosin (MINIPRESS) 2 MG capsule Take 2 mg by mouth at bedtime.    [provider]  tiZANidine (ZANAFLEX) 4 MG tablet Take 1 tablet (4 mg total) by mouth every 6 (six) hours as needed for muscle spasms. 10/07/17   Deeann Saint, MD    Family History History  reviewed. No pertinent family history.  Social History Social History   Tobacco Use  . Smoking status: Former Games developer  . Smokeless tobacco: Former Engineer, water Use Topics  . Alcohol use: Not Currently  . Drug use: Not Currently     Allergies   Morphine and related   Review of Systems Review of Systems  Constitutional: Positive for fatigue. Negative for chills and fever.  HENT: Negative.   Eyes: Negative.   Respiratory: Positive for cough and shortness of breath.   Cardiovascular:       Chest discomfort only with cough  Musculoskeletal: Positive for myalgias.  Skin: Negative for rash.  Allergic/Immunologic: Negative.   Psychiatric/Behavioral: Negative.      Physical Exam Triage Vital Signs ED Triage Vitals  Enc Vitals Group     BP 01/29/19 1303 127/83     Pulse Rate 01/29/19 1303 (!) 106     Resp 01/29/19 1303 18     Temp 01/29/19 1303 97.6 F (36.4 C)     Temp Source 01/29/19 1303 Temporal     SpO2 01/29/19 1303 97 %     Weight --      Height --      Head Circumference --  Peak Flow --      Pain Score 01/29/19 1302 8     Pain Loc --      Pain Edu? --      Excl. in [place]? --    No data found.  Updated Vital Signs BP 127/83 (BP Location: Right Arm)   Pulse (!) 106   Temp 97.6 F (36.4 C) (Temporal)   Resp 18   SpO2 97%   Visual Acuity Right Eye Distance:   Left Eye Distance:   Bilateral Distance:    Right Eye Near:   Left Eye Near:    Bilateral Near:     Physical Exam Vitals signs and nursing note reviewed.  Constitutional:      General: She is not in acute distress.    Appearance: She is well-developed. She is obese. She is not ill-appearing.  HENT:     Head: Normocephalic and atraumatic.  Neck:     Musculoskeletal: Normal range of motion.     Comments: Mild cervical lymphadenopathy Pulmonary:     Effort: Pulmonary effort is normal. No respiratory distress.     Breath sounds: Normal breath sounds. No stridor.  Skin:    General:  Skin is warm and dry.  Neurological:     General: No focal deficit present.     Mental Status: She is alert.  Psychiatric:        Mood and Affect: Mood normal.      UC Treatments / Results  Labs (all labs ordered are listed, but only abnormal results are displayed) Labs Reviewed - No data to display  EKG   Radiology No results found.  Procedures Procedures (including critical care time)  Medications Ordered in UC Medications - No data to display  Initial Impression / Assessment and Plan / UC Course  I have reviewed the triage vital signs and the nursing notes.  Pertinent labs & imaging results that were available during my care of the patient were reviewed by me and considered in my medical decision making (see chart for details).    Covid screen. Self isolate. Symptomatic care at this time.  Final Clinical Impressions(s) / UC Diagnoses   Final diagnoses:  None   Discharge Instructions   None    ED Prescriptions    None     PDMP not reviewed this encounter.   Bjorn Pippin, PA-C 01/29/19 1338

## 2019-01-29 NOTE — ED Notes (Signed)
I spoke with provider Leafy Kindle about the pt and she has the EKG.

## 2019-01-29 NOTE — ED Triage Notes (Addendum)
Pt states 2 days ago she started feeling weak, last night she started having cough, chest pain when coughing. Pt states having  headache, diarrhea x 1 day. Pt states she started SOB last night, worsened when she walks.   Pt refused to have aq Covid test. Pt had a Covid test done on 01/23/2019, pt is waiting for results.

## 2019-01-30 LAB — SARS CORONAVIRUS 2 (TAT 6-24 HRS): SARS Coronavirus 2: POSITIVE — AB

## 2019-01-31 ENCOUNTER — Telehealth (INDEPENDENT_AMBULATORY_CARE_PROVIDER_SITE_OTHER): Payer: BC Managed Care – PPO | Admitting: Family Medicine

## 2019-01-31 DIAGNOSIS — U071 COVID-19: Secondary | ICD-10-CM

## 2019-01-31 MED ORDER — TRAZODONE HCL 50 MG PO TABS: 25.0000 mg | ORAL_TABLET | Freq: Every evening | ORAL | Status: AC | PRN

## 2019-01-31 MED ORDER — ALBUTEROL SULFATE HFA 108 (90 BASE) MCG/ACT IN AERS: 2.0000 | INHALATION_SPRAY | Freq: Four times a day (QID) | RESPIRATORY_TRACT | 3 refills | Status: DC | PRN

## 2019-01-31 MED ORDER — NORTRIPTYLINE HCL 75 MG PO CAPS: 75.0000 mg | ORAL_CAPSULE | Freq: Every day | ORAL | 0 refills | Status: DC

## 2019-01-31 NOTE — Progress Notes (Signed)
Virtual Visit via Video Note  I connected with Deno Etienne on 01/31/19 at  2:00 PM EST by a video enabled telemedicine application 2/2 POEUM-35 pandemic and verified that I am speaking with the correct person using two identifiers.  Location patient: home Location provider:work or home office Persons participating in the virtual visit: patient, provider  I discussed the limitations of evaluation and management by telemedicine and the availability of in person appointments. The patient expressed understanding and agreed to proceed.   HPI: Started getting a HA and feeling achy on Friday.  Saturday had cough, diarrhea, chills, sweating, SOB.  Pt was seen at [facility] where she tested positive for COVID-19.  Given tessalon which is not helping.  Taking tylenol.  Has decreased appetite-eating jello and broth.   Prior to getting sick, pt was working out at [employer].    ~Updates to pt's med list-taking Trazodone.  Pamelor increased to 75 mg QHS.  ROS: See pertinent positives and negatives per HPI.  Past Medical History:  Diagnosis Date  . Arthritis   . Colon polyps   . Depression   . Urinary incontinence     Past Surgical History:  Procedure Laterality Date  . ANKLE SURGERY    . BACK SURGERY  2013,2017  . CHOLECYSTECTOMY    . GASTRIC BYPASS      No family history on file.   Current Outpatient Medications:  .  benzonatate (TESSALON) 100 MG capsule, Take 1 capsule (100 mg total) by mouth every 8 (eight) hours., Disp: 21 capsule, Rfl: 0 .  clonazePAM (KLONOPIN) 2 MG tablet, Take 2 mg by mouth as needed for anxiety., Disp: , Rfl:  .  mirtazapine (REMERON) 45 MG tablet, Take 45 mg by mouth at bedtime., Disp: , Rfl:  .  nortriptyline (PAMELOR) 50 MG capsule, Take 50 mg by mouth at bedtime. Take 1 to 11/2 tablet at bedtime, Disp: , Rfl:  .  prazosin (MINIPRESS) 2 MG capsule, Take 2 mg by mouth at bedtime., Disp: , Rfl:  .  tiZANidine (ZANAFLEX) 4 MG tablet, Take 1 tablet (4 mg total) by mouth  every 6 (six) hours as needed for muscle spasms., Disp: 90 tablet, Rfl: 3  EXAM:  VITALS per patient if applicable:  RR between 12-20 bpm  GENERAL: alert, oriented, appears well and in no acute distress  HEENT: atraumatic, conjunctiva clear, no obvious abnormalities on inspection of external nose and ears  NECK: normal movements of the head and neck  LUNGS: Coughing.  On inspection no signs of respiratory distress, breathing rate appears normal, no obvious gross SOB, gasping or wheezing  CV: no obvious cyanosis  MS: moves all visible extremities without noticeable abnormality  PSYCH/NEURO: pleasant and cooperative, no obvious depression or anxiety, speech and thought processing grossly intact  ASSESSMENT AND PLAN:  Discussed the following assessment and plan:  COVID-19 virus infection  -discussed supportive care -ok to take tylenol prn, imodium for diarrhea, hydration encouraged. -refilled albuterol inhaler  -pt to self quarantine x 14 days -given precautions   F/u prn   I discussed the assessment and treatment plan with the patient. The patient was provided an opportunity to ask questions and all were answered. The patient agreed with the plan and demonstrated an understanding of the instructions.   The patient was advised to call back or seek an in-person evaluation if the symptoms worsen or if the condition fails to improve as anticipated.   Billie Ruddy, MD

## 2019-01-31 NOTE — Telephone Encounter (Signed)
Pt seen at [facility].  

## 2019-01-31 NOTE — Telephone Encounter (Signed)
Recent COVID results in chart.

## 2019-01-31 NOTE — Addendum Note (Signed)
Addended by: Wyvonne Lenz on: 01/31/2019 02:59 PM   Modules accepted: Orders

## 2019-02-01 ENCOUNTER — Encounter (HOSPITAL_COMMUNITY): Payer: Self-pay

## 2019-02-01 ENCOUNTER — Telehealth (HOSPITAL_COMMUNITY): Payer: Self-pay | Admitting: Emergency Medicine

## 2019-02-01 NOTE — Telephone Encounter (Signed)
Positive covid detected on swab. Pt was notified by her PCP during virtual visit. Left VM on pt phone to return call if she has any questions. Sent mychart message as well.

## 2019-07-26 ENCOUNTER — Encounter (HOSPITAL_COMMUNITY): Payer: Self-pay | Admitting: Emergency Medicine

## 2019-07-26 ENCOUNTER — Emergency Department (HOSPITAL_COMMUNITY): Payer: BC Managed Care – PPO

## 2019-07-26 ENCOUNTER — Other Ambulatory Visit: Payer: Self-pay

## 2019-07-26 ENCOUNTER — Emergency Department (HOSPITAL_COMMUNITY)
Admission: EM | Admit: 2019-07-26 | Discharge: 2019-07-26 | Disposition: A | Discharge status: Home / Self Care | Payer: BC Managed Care – PPO | Attending: Emergency Medicine | Admitting: Emergency Medicine

## 2019-07-26 DIAGNOSIS — S0990XA Unspecified injury of head, initial encounter: Secondary | ICD-10-CM | POA: Diagnosis present

## 2019-07-26 DIAGNOSIS — M542 Cervicalgia: Secondary | ICD-10-CM | POA: Insufficient documentation

## 2019-07-26 DIAGNOSIS — Z87891 Personal history of nicotine dependence: Secondary | ICD-10-CM | POA: Insufficient documentation

## 2019-07-26 DIAGNOSIS — W19XXXA Unspecified fall, initial encounter: Secondary | ICD-10-CM

## 2019-07-26 DIAGNOSIS — H9201 Otalgia, right ear: Secondary | ICD-10-CM | POA: Insufficient documentation

## 2019-07-26 DIAGNOSIS — M25511 Pain in right shoulder: Secondary | ICD-10-CM | POA: Diagnosis not present

## 2019-07-26 DIAGNOSIS — W0110XA Fall on same level from slipping, tripping and stumbling with subsequent striking against unspecified object, initial encounter: Secondary | ICD-10-CM | POA: Insufficient documentation

## 2019-07-26 DIAGNOSIS — Y929 Unspecified place or not applicable: Secondary | ICD-10-CM | POA: Insufficient documentation

## 2019-07-26 DIAGNOSIS — Y939 Activity, unspecified: Secondary | ICD-10-CM | POA: Diagnosis not present

## 2019-07-26 DIAGNOSIS — Z79899 Other long term (current) drug therapy: Secondary | ICD-10-CM | POA: Insufficient documentation

## 2019-07-26 DIAGNOSIS — Y999 Unspecified external cause status: Secondary | ICD-10-CM | POA: Insufficient documentation

## 2019-07-26 MED ORDER — HYDROCODONE-ACETAMINOPHEN 5-325 MG PO TABS: 1.0000 | ORAL_TABLET | Freq: Four times a day (QID) | ORAL | 0 refills | Status: DC | PRN

## 2019-07-26 MED ORDER — METHOCARBAMOL 500 MG PO TABS
1000.0000 mg | ORAL_TABLET | Freq: Once | ORAL | Status: AC
  Administered 2019-07-26: 1000 mg via ORAL
  Filled 2019-07-26: qty 2

## 2019-07-26 MED ORDER — METHOCARBAMOL 500 MG PO TABS
500.0000 mg | ORAL_TABLET | Freq: Two times a day (BID) | ORAL | 0 refills | Status: DC | PRN
Start: 2019-07-26 — End: 2020-02-02

## 2019-07-26 NOTE — ED Provider Notes (Signed)
[facility] EMERGENCY DEPARTMENT Provider Note   CSN: [id number] Arrival date & time: 07/26/19  6789     History Chief Complaint  Patient presents with  . Fall    Tina Harrison is a 64 y.o. female presenting for evaluation after fall.  Patient states yesterday she slipped, and hit her right head and shoulder on the toilet as she fell.  She denies loss of consciousness.  She reports pain in her head, ear, neck, and shoulder since the fall yesterday.  She has taken her home tizanidine without improvement of symptoms.  She has not tried anything else.  She is not on blood thinners.  She denies pain on her left side.  She has been able to ambulate since.  She denies numbness or tingling in her hands.  No pain of her distal RUE, back, chest, abd, or legs.  HPI     Past Medical History:  Diagnosis Date  . Arthritis   . Colon polyps   . Depression   . Urinary incontinence     Patient Active Problem List   Diagnosis Date Noted  .  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 Bipolar affective disorder (HCC) 09/02/2017  . 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 PTSD (post-traumatic stress disorder) 09/02/2017  . Fibromyalgia 09/02/2017    Past Surgical History:  Procedure Laterality Date  . ANKLE SURGERY    . BACK SURGERY  2013,2017  . CHOLECYSTECTOMY    . GASTRIC BYPASS       OB History   No obstetric history on file.     No family history on file.  Social History   Tobacco Use  . Smoking status: Former Games developer  . Smokeless tobacco: Former Engineer, water Use Topics  . Alcohol use: Not Currently  . Drug use: Not Currently    Home Medications Prior to Admission medications   Medication Sig Start Date End Date Taking? Authorizing Provider  albuterol (VENTOLIN HFA) 108 (90 Base) MCG/ACT inhaler Inhale 2 puffs into the lungs every 6 (six) hours as needed for wheezing or shortness of breath. 01/31/19   Deeann Saint, MD    benzonatate (TESSALON) 100 MG capsule Take 1 capsule (100 mg total) by mouth every 8 (eight) hours. 01/29/19   Riki Sheer, PA-C  clonazePAM (KLONOPIN) 2 MG tablet Take 2 mg by mouth as needed for anxiety.    [provider]  HYDROcodone-acetaminophen (NORCO/VICODIN) 5-325 MG tablet Take 1 tablet by mouth every 6 (six) hours as needed for severe pain. 07/26/19   Arvis Miguez, PA-C  methocarbamol (ROBAXIN) 500 MG tablet Take 1 tablet (500 mg total) by mouth 2 (two) times daily as needed for muscle spasms. 07/26/19   Admire Bunnell, PA-C  mirtazapine (REMERON) 45 MG tablet Take 45 mg by mouth at bedtime.    [provider]  nortriptyline (PAMELOR) 75 MG capsule Take 1 capsule (75 mg total) by mouth at bedtime. 01/31/19   Deeann Saint, MD  prazosin (MINIPRESS) 2 MG capsule Take 2 mg by mouth at bedtime.    [provider]  tiZANidine (ZANAFLEX) 4 MG tablet Take 1 tablet (4 mg total) by mouth every 6 (six) hours as needed for muscle spasms. 10/07/17   Deeann Saint, MD  traZODone (DESYREL) 50 MG tablet Take 0.5-1 tablets (25-50 mg total) by mouth at bedtime as needed for sleep. 01/31/19   Deeann Saint, MD    Allergies  Morphine and related  Review of Systems   Review of Systems  HENT:       R side facial pain  Musculoskeletal: Positive for arthralgias, myalgias and neck pain.  Neurological: Positive for headaches.  All other systems reviewed and are negative.   Physical Exam Updated Vital Signs BP (!) 142/83 (BP Location: Left Arm)   Pulse 73   Temp 98.2 F (36.8 C) (Oral)   Resp 15   Ht 5\' 4"  (1.626 m)   Wt 104.3 kg   SpO2 99%   BMI 39.48 kg/m   Physical Exam Vitals and nursing note reviewed.  Constitutional:      General: She is not in acute distress.    Appearance: She is well-developed.     Comments: appears nontoxic  HENT:     Head: Normocephalic and atraumatic.     Comments: No sign of head trauma. No hemotypnanum or nasal  septal hematoma. No trismus or malocclusion.  ttp of the R cheek and TMJ. ttp of the R ear, no battle sign.  Eyes:     Conjunctiva/sclera: Conjunctivae normal.     Pupils: Pupils are equal, round, and reactive to light.  Neck:     Comments: TTP of the R side neck musculature.  minimal ttp over midline c-spine.  Cardiovascular:     Rate and Rhythm: Normal rate and regular rhythm.     Pulses: Normal pulses.  Pulmonary:     Effort: No respiratory distress.     Breath sounds: Normal breath sounds. No wheezing.  Abdominal:     General: There is no distension.     Palpations: Abdomen is soft. There is no mass.     Tenderness: There is no abdominal tenderness. There is no guarding or rebound.  Musculoskeletal:        General: Tenderness present. Normal range of motion.     Cervical back: Normal range of motion and neck supple. Tenderness present.     Comments: TTP of the entire R shoulder and upper arm. No obvious deformity. No swelling or erythema. No ttp of the forearm or hand. Radial pulses 2+ bilaterally. Full active ROM of the R elbow and wrist without pain. Pt unable to range R shoulder due to pain.   Skin:    General: Skin is warm and dry.     Capillary Refill: Capillary refill takes less than 2 seconds.  Neurological:     Mental Status: She is alert and oriented to person, place, and time.     ED Results / Procedures / Treatments   Labs (all labs ordered are listed, but only abnormal results are displayed) Labs Reviewed - No data to display  EKG None  Radiology DG Shoulder Right  Result Date: 07/26/2019 CLINICAL DATA:  Pain following fall EXAM: RIGHT SHOULDER - 2+ VIEW COMPARISON:  October 07, 2017 FINDINGS: Oblique, Y scapular, and axillary images were obtained. No fracture or dislocation. Joint spaces appear unremarkable. No erosive change. Visualized right lung clear. IMPRESSION: No fracture or dislocation. No appreciable joint space narrowing or erosion. Electronically Signed    By: October 09, 2017 III M.D.   On: 07/26/2019 10:08   CT Head Wo Contrast  Result Date: 07/26/2019 CLINICAL DATA:  Right-sided headache and facial pain since fall yesterday, hitting her face on the toilet. Denies loss of consciousness or taking blood thinners. EXAM: CT HEAD WITHOUT CONTRAST CT MAXILLOFACIAL WITHOUT CONTRAST CT CERVICAL SPINE WITHOUT CONTRAST TECHNIQUE: Multidetector CT imaging of the head, cervical spine,  and maxillofacial structures were performed using the standard protocol without intravenous contrast. Multiplanar CT image reconstructions of the cervical spine and maxillofacial structures were also generated. COMPARISON:  None. FINDINGS: CT HEAD FINDINGS Brain: No evidence of acute infarction, hemorrhage, hydrocephalus, extra-axial collection or mass lesion/mass effect. Mild generalized cerebral atrophy, within normal limits for patient's age. Vascular: No hyperdense vessel or unexpected calcification. Skull: Normal. Negative for fracture or focal lesion. Other: None. CT MAXILLOFACIAL FINDINGS Osseous: No fracture or mandibular dislocation. No destructive process. Orbits: Negative. No traumatic or inflammatory finding. Sinuses: Minimal mucosal thickening in the left maxillary sinus. Otherwise clear. Soft tissues: Negative. CT CERVICAL SPINE FINDINGS Alignment: Straightening and mild reversal of the normal cervical lordosis. No traumatic malalignment. Skull base and vertebrae: No acute fracture. No primary bone lesion or focal pathologic process. Soft tissues and spinal canal: No prevertebral fluid or swelling. No visible canal hematoma. Disc levels: Moderate disc height loss and mild uncovertebral hypertrophy at C5-C6 and C6-C7. Mild disc height loss at C3-C4, C4-C5, and C7-T1. Mild facet arthropathy throughout the cervical spine. Upper chest: Negative. Other: None. IMPRESSION: 1. No acute intracranial abnormality. 2. No acute maxillofacial fracture. 3. No acute cervical spine fracture or  traumatic listhesis. Multilevel degenerative changes of the cervical spine. Electronically Signed   By: Obie Dredge M.D.   On: 07/26/2019 11:09   CT Cervical Spine Wo Contrast  Result Date: 07/26/2019 CLINICAL DATA:  Right-sided headache and facial pain since fall yesterday, hitting her face on the toilet. Denies loss of consciousness or taking blood thinners. EXAM: CT HEAD WITHOUT CONTRAST CT MAXILLOFACIAL WITHOUT CONTRAST CT CERVICAL SPINE WITHOUT CONTRAST TECHNIQUE: Multidetector CT imaging of the head, cervical spine, and maxillofacial structures were performed using the standard protocol without intravenous contrast. Multiplanar CT image reconstructions of the cervical spine and maxillofacial structures were also generated. COMPARISON:  None. FINDINGS: CT HEAD FINDINGS Brain: No evidence of acute infarction, hemorrhage, hydrocephalus, extra-axial collection or mass lesion/mass effect. Mild generalized cerebral atrophy, within normal limits for patient's age. Vascular: No hyperdense vessel or unexpected calcification. Skull: Normal. Negative for fracture or focal lesion. Other: None. CT MAXILLOFACIAL FINDINGS Osseous: No fracture or mandibular dislocation. No destructive process. Orbits: Negative. No traumatic or inflammatory finding. Sinuses: Minimal mucosal thickening in the left maxillary sinus. Otherwise clear. Soft tissues: Negative. CT CERVICAL SPINE FINDINGS Alignment: Straightening and mild reversal of the normal cervical lordosis. No traumatic malalignment. Skull base and vertebrae: No acute fracture. No primary bone lesion or focal pathologic process. Soft tissues and spinal canal: No prevertebral fluid or swelling. No visible canal hematoma. Disc levels: Moderate disc height loss and mild uncovertebral hypertrophy at C5-C6 and C6-C7. Mild disc height loss at C3-C4, C4-C5, and C7-T1. Mild facet arthropathy throughout the cervical spine. Upper chest: Negative. Other: None. IMPRESSION: 1. No acute  intracranial abnormality. 2. No acute maxillofacial fracture. 3. No acute cervical spine fracture or traumatic listhesis. Multilevel degenerative changes of the cervical spine. Electronically Signed   By: Obie Dredge M.D.   On: 07/26/2019 11:09   CT Maxillofacial Wo Contrast  Result Date: 07/26/2019 CLINICAL DATA:  Right-sided headache and facial pain since fall yesterday, hitting her face on the toilet. Denies loss of consciousness or taking blood thinners. EXAM: CT HEAD WITHOUT CONTRAST CT MAXILLOFACIAL WITHOUT CONTRAST CT CERVICAL SPINE WITHOUT CONTRAST TECHNIQUE: Multidetector CT imaging of the head, cervical spine, and maxillofacial structures were performed using the standard protocol without intravenous contrast. Multiplanar CT image reconstructions of the cervical spine and maxillofacial structures  were also generated. COMPARISON:  None. FINDINGS: CT HEAD FINDINGS Brain: No evidence of acute infarction, hemorrhage, hydrocephalus, extra-axial collection or mass lesion/mass effect. Mild generalized cerebral atrophy, within normal limits for patient's age. Vascular: No hyperdense vessel or unexpected calcification. Skull: Normal. Negative for fracture or focal lesion. Other: None. CT MAXILLOFACIAL FINDINGS Osseous: No fracture or mandibular dislocation. No destructive process. Orbits: Negative. No traumatic or inflammatory finding. Sinuses: Minimal mucosal thickening in the left maxillary sinus. Otherwise clear. Soft tissues: Negative. CT CERVICAL SPINE FINDINGS Alignment: Straightening and mild reversal of the normal cervical lordosis. No traumatic malalignment. Skull base and vertebrae: No acute fracture. No primary bone lesion or focal pathologic process. Soft tissues and spinal canal: No prevertebral fluid or swelling. No visible canal hematoma. Disc levels: Moderate disc height loss and mild uncovertebral hypertrophy at C5-C6 and C6-C7. Mild disc height loss at C3-C4, C4-C5, and C7-T1. Mild facet  arthropathy throughout the cervical spine. Upper chest: Negative. Other: None. IMPRESSION: 1. No acute intracranial abnormality. 2. No acute maxillofacial fracture. 3. No acute cervical spine fracture or traumatic listhesis. Multilevel degenerative changes of the cervical spine. Electronically Signed   By: Titus Dubin M.D.   On: 07/26/2019 11:09    Procedures Procedures (including critical care time)  Medications Ordered in ED Medications  methocarbamol (ROBAXIN) tablet 1,000 mg (1,000 mg Oral Given 07/26/19 7062)    ED Course  I have reviewed the triage vital signs and the nursing notes.  Pertinent labs & imaging results that were available during my care of the patient were reviewed by me and considered in my medical decision making (see chart for details).    MDM Rules/Calculators/A&P                      Patient presenting for evaluation of right-sided head, neck, and shoulder pain after fall.  On exam, patient peers nontoxic.  She is neurovascularly intact.  She is not on blood thinners.  However, considering her severe pain today, will obtain CTs of the head, max face, and and neck.  Obtain shoulder x-ray.  Will treat with Robaxin.  CTs negative for acute findings.  X-ray viewed interpreted by me, no fracture dislocation.  On reassessment, patient reports pain is improved with Robaxin.  As such, likely muscle strain.  Discussed findings with patient, and importance of continued symptomatic treatment.  Discussed typical course of muscle stiffness/soreness, and follow-up with PCP as needed.  At this time, patient appears safe for discharge.  Return precautions given.  Patient states she understands and agrees to plan.  Final Clinical Impression(s) / ED Diagnoses Final diagnoses:  Right ear pain  Neck pain on right side  Acute pain of right shoulder  Fall, initial encounter    Rx / DC Orders ED Discharge Orders         Ordered    methocarbamol (ROBAXIN) 500 MG tablet  2 times  daily PRN     07/26/19 1137    HYDROcodone-acetaminophen (NORCO/VICODIN) 5-325 MG tablet  Every 6 hours PRN     07/26/19 1137           Chieko Neises, PA-C 07/26/19 1150    Davonna Belling, MD 07/26/19 [phone]

## 2019-07-26 NOTE — ED Triage Notes (Signed)
Pt reports slipping in her bathroom yesterday evening, falling and striking her head on the toilet, denies blood thinners, denies LOC. A/ox4. C/o R sided head and shoulder pain with difficulty moving R arm.

## 2019-07-26 NOTE — Discharge Instructions (Signed)
Take ibuprofen 3 times a day with meals.  Do not take other anti-inflammatories at the same time (Advil, Motrin, naproxen, Aleve). You may supplement with Tylenol if you need further pain control. Use Norco as needed for severe breakthrough pain.  Have caution, this make you tired or groggy.  Do not drive or operate machinery while take this medicine. Use robaxin as needed for muscle stiffness or soreness.  Do not take tizanidine while taking this medicine. Use ice packs or heating pads if this helps control your pain. Use muscle creams/patches such as Salonpas, icy hot, BenGay, Biofreeze to help with pain control. You will likely have continued muscle stiffness and soreness over the next couple days.  Follow-up with primary care in 1 week if your symptoms are not improving. Return to the emergency room if you develop vision changes, vomiting, slurred speech, numbness, loss of bowel or bladder control, or any new or worsening symptoms.

## 2019-08-12 ENCOUNTER — Other Ambulatory Visit: Payer: Self-pay | Admitting: Family Medicine

## 2019-09-25 IMAGING — DX DG SHOULDER 2+V*R*
3 series · 3 of 3 positions shown · non-contrast
Comparison: None.

CLINICAL DATA: Chronic right shoulder pain

EXAM:
RIGHT SHOULDER - 2+ VIEW

[shoulder internal rotation ap]
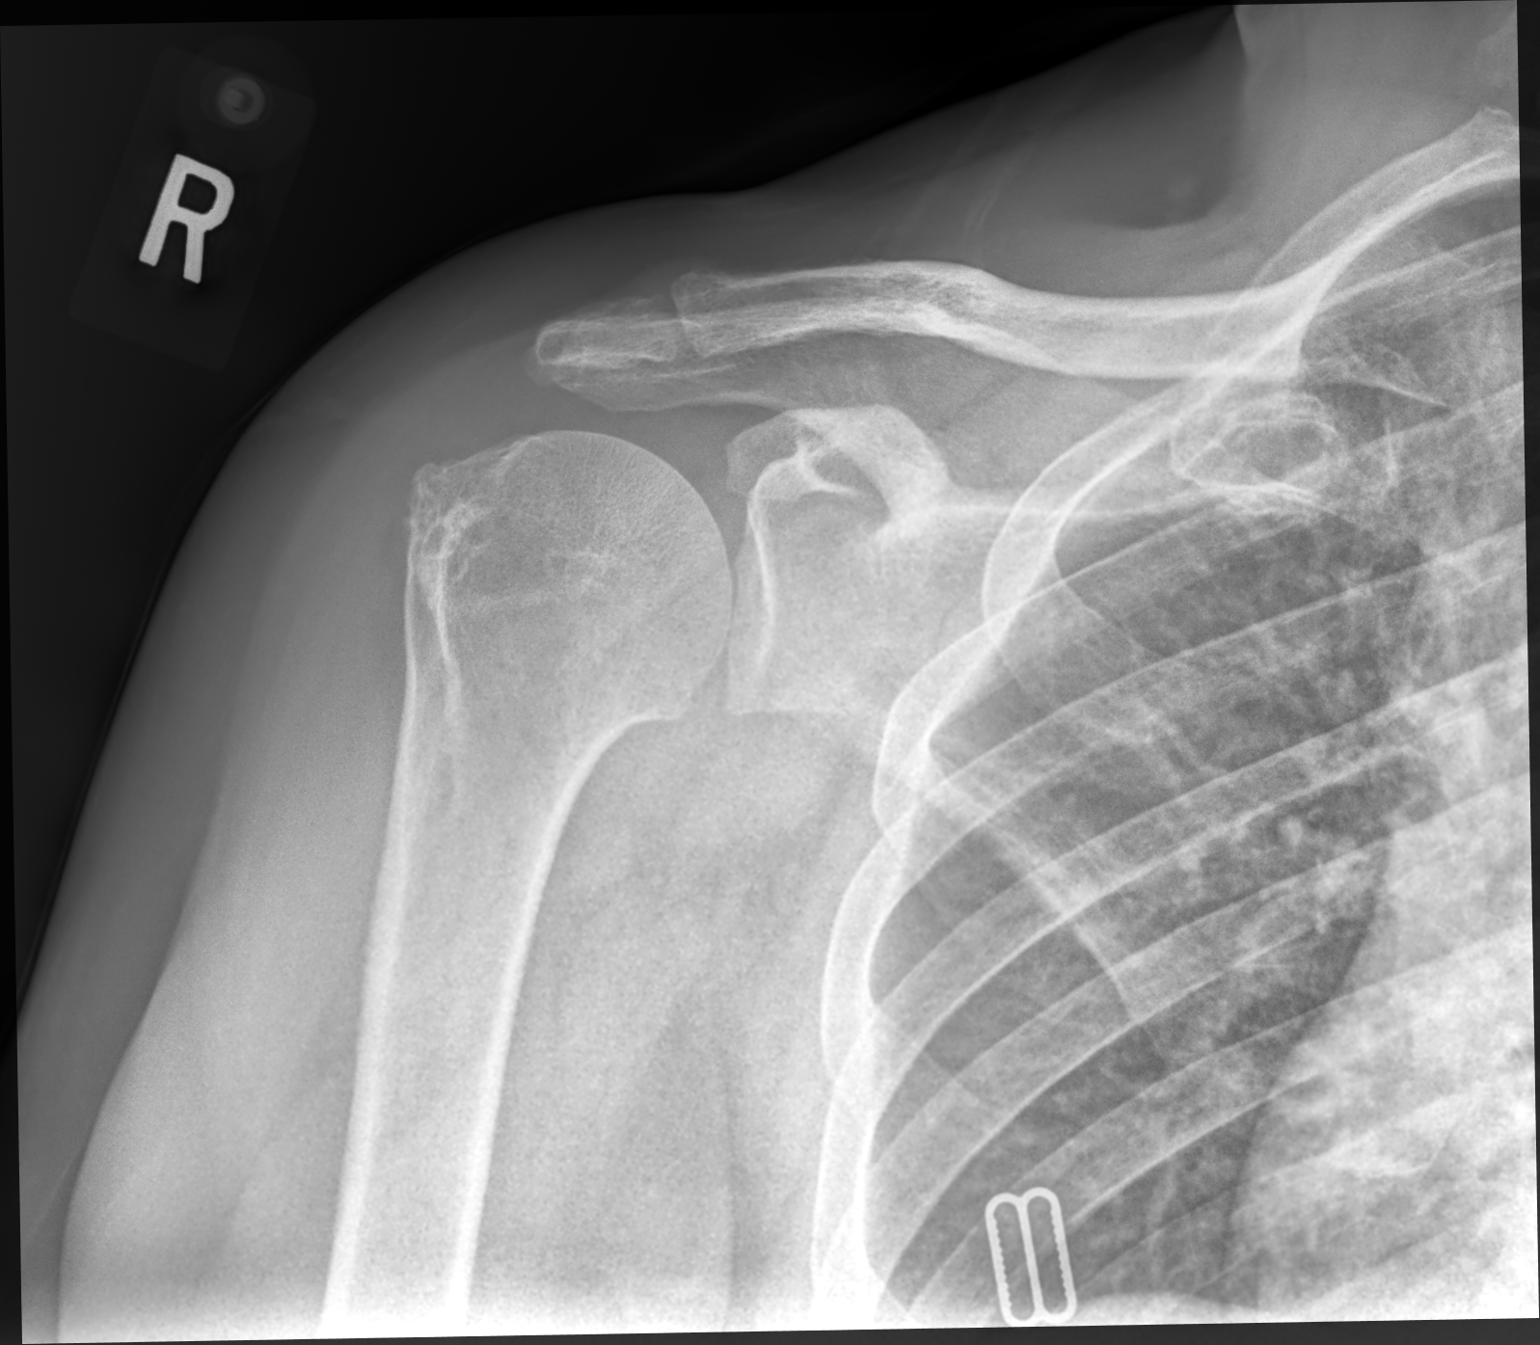

[shoulder (y view)]
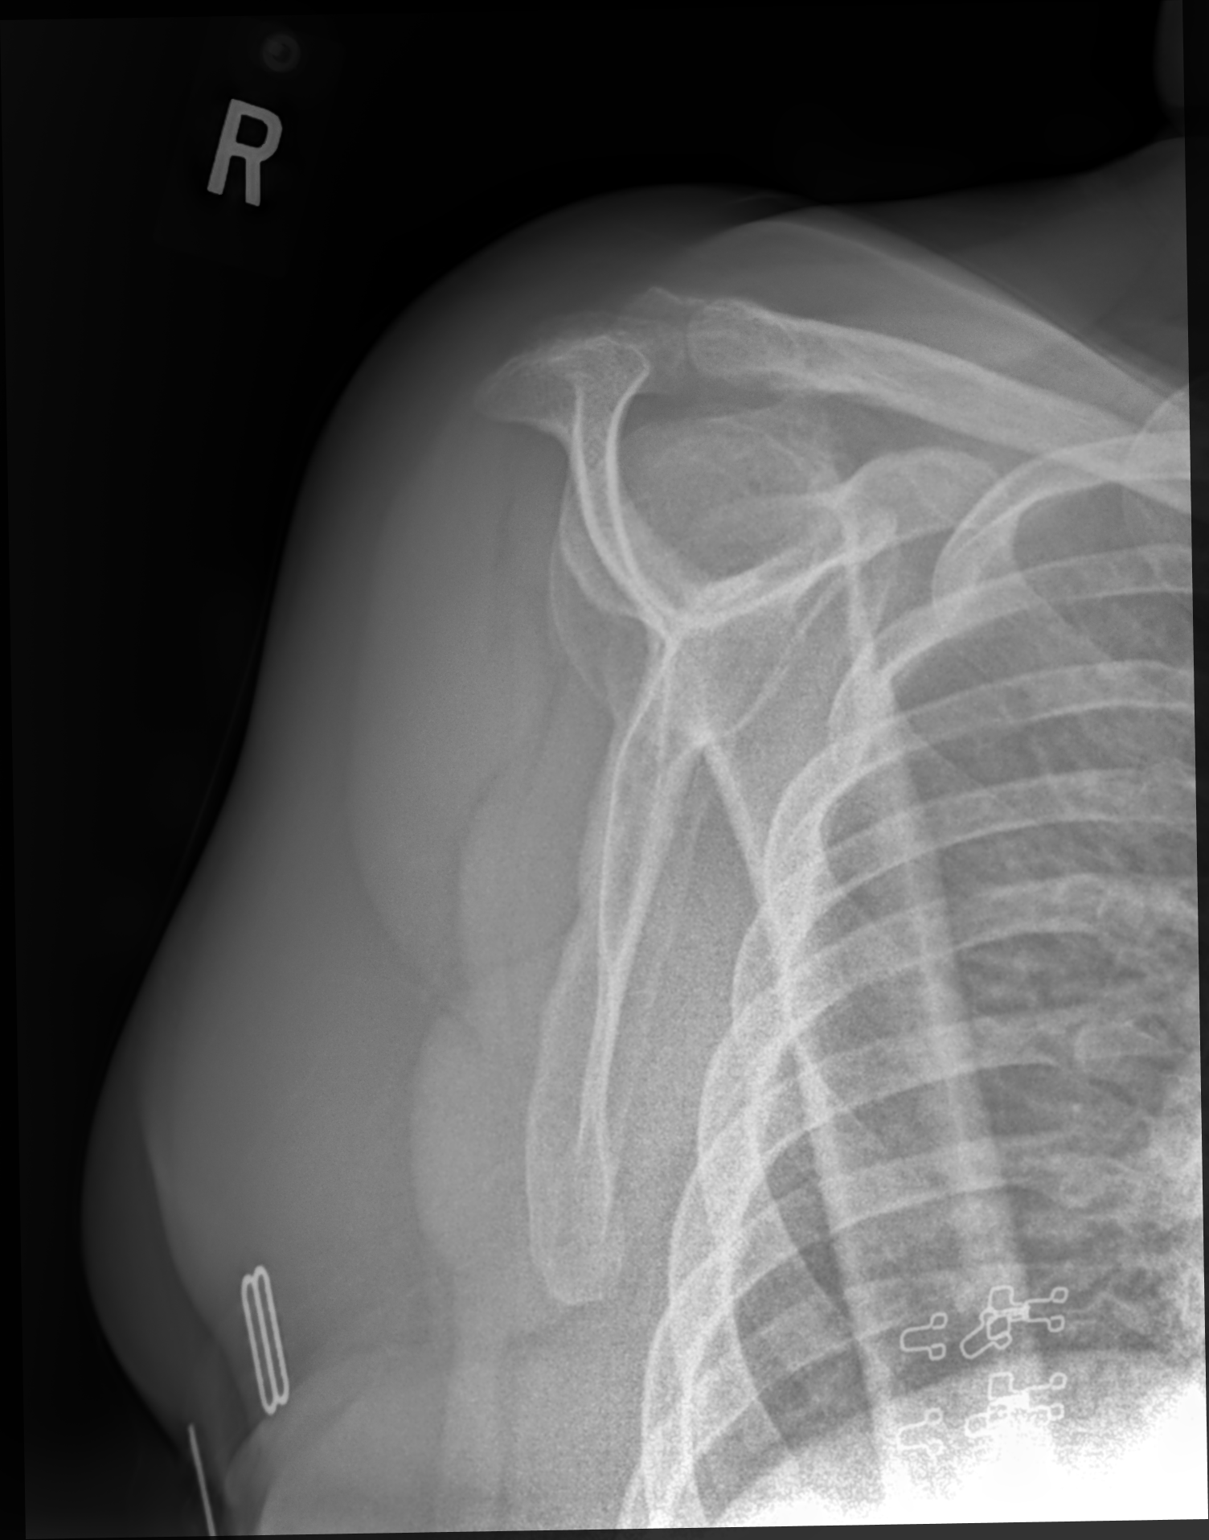

[shoulder (axial)]
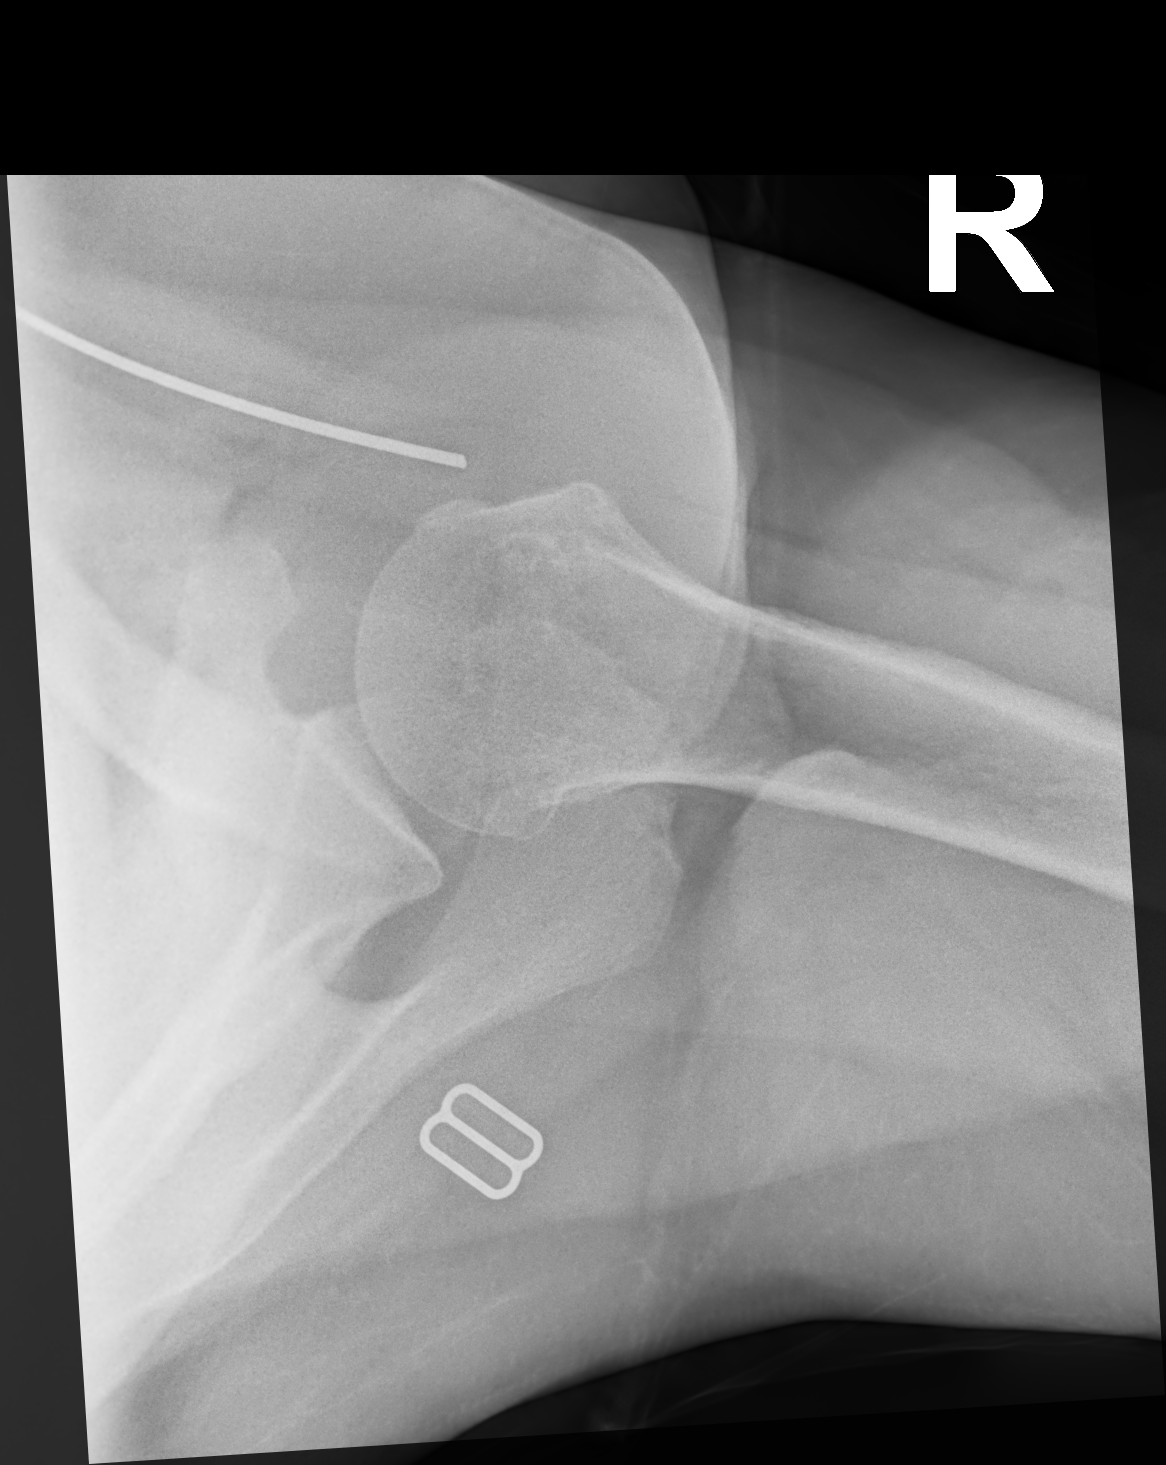

[3 of 3 positions shown; findings below may reference images not displayed]

FINDINGS: Normal alignment no fracture or mass. AC joint intact. Irregularity
of the greater tuberosity compatible chronic rotator cuff disease.
Glenohumeral joint intact.
IMPRESSION: No acute abnormality. Bony changes consistent with chronic rotator
cuff tendinopathy.

## 2019-10-04 ENCOUNTER — Other Ambulatory Visit: Payer: Self-pay

## 2019-10-04 MED ORDER — ALBUTEROL SULFATE HFA 108 (90 BASE) MCG/ACT IN AERS: INHALATION_SPRAY | RESPIRATORY_TRACT | 0 refills | Status: DC

## 2019-10-04 NOTE — Telephone Encounter (Signed)
Rx refilled electronically after receiving refill fax.

## 2019-10-10 ENCOUNTER — Telehealth: Payer: Self-pay | Admitting: *Deleted

## 2019-10-10 NOTE — Telephone Encounter (Signed)
[facility] from [facility] needs to speak with Nurse about a coverage issue on the prescription  Ref No: [id number].

## 2019-10-12 NOTE — Telephone Encounter (Signed)
Tried contacting [employer], line hangs up; will try again.

## 2019-10-19 NOTE — Telephone Encounter (Signed)
[facility]ried [facility] on hold for a long period of time, notable to speak with anyone will try again later

## 2019-10-21 ENCOUNTER — Telehealth: Payer: Self-pay | Admitting: Family Medicine

## 2019-10-21 NOTE — Telephone Encounter (Signed)
See other phone note

## 2019-10-21 NOTE — Telephone Encounter (Signed)
Tina Harrison from [facility] call an want a new RX for 90 days and 3 refills on albuterol (VENTOLIN HFA) 108 (90 Base) MCG/ACT inhaler sent to them at fax # [phone] [facility] ph #[phone].

## 2019-10-25 ENCOUNTER — Other Ambulatory Visit: Payer: Self-pay

## 2019-10-25 ENCOUNTER — Telehealth: Payer: Self-pay | Admitting: Family Medicine

## 2019-10-25 MED ORDER — ALBUTEROL SULFATE HFA 108 (90 BASE) MCG/ACT IN AERS
2.0000 | INHALATION_SPRAY | Freq: Four times a day (QID) | RESPIRATORY_TRACT | 1 refills | Status: DC | PRN
Start: 2019-10-25 — End: 2020-04-06

## 2019-10-25 MED ORDER — ALBUTEROL SULFATE HFA 108 (90 BASE) MCG/ACT IN AERS: INHALATION_SPRAY | RESPIRATORY_TRACT | 3 refills | Status: DC

## 2019-10-25 NOTE — Telephone Encounter (Signed)
Shamiya from [facility] called to say  Pt plan does not cover the ventalin  HFA inhaler  But it does cover albuterol pro air HFA inhaler  [phone]  Reference #[id number]  Please advise

## 2019-10-25 NOTE — Telephone Encounter (Signed)
That's fine

## 2019-10-25 NOTE — Telephone Encounter (Signed)
Pt Rx was [facility] sent to [facility]

## 2019-10-25 NOTE — Telephone Encounter (Signed)
Ok to change

## 2019-10-25 NOTE — Telephone Encounter (Signed)
Rx sent by [facility]. 

## 2019-12-22 ENCOUNTER — Encounter: Payer: BC Managed Care – PPO | Admitting: Family Medicine

## 2019-12-26 ENCOUNTER — Encounter: Payer: BC Managed Care – PPO | Admitting: Family Medicine

## 2020-02-02 ENCOUNTER — Encounter: Payer: Self-pay | Admitting: Family Medicine

## 2020-02-02 ENCOUNTER — Other Ambulatory Visit: Payer: Self-pay

## 2020-02-02 ENCOUNTER — Ambulatory Visit (INDEPENDENT_AMBULATORY_CARE_PROVIDER_SITE_OTHER): Payer: BC Managed Care – PPO | Admitting: Family Medicine

## 2020-02-02 ENCOUNTER — Other Ambulatory Visit (HOSPITAL_COMMUNITY)
Admission: RE | Admit: 2020-02-02 | Discharge: 2020-02-02 | Disposition: A | Discharge status: Home / Self Care | Payer: BC Managed Care – PPO | Source: Ambulatory Visit | Attending: Family Medicine | Admitting: Family Medicine

## 2020-02-02 VITALS — BP 130/80 | HR 74 | Temp 97.8°F | Ht 64.0 in | Wt 250.8 lb

## 2020-02-02 DIAGNOSIS — Z0000 Encounter for general adult medical examination without abnormal findings: Secondary | ICD-10-CM | POA: Diagnosis not present

## 2020-02-02 DIAGNOSIS — Z9884 Bariatric surgery status: Secondary | ICD-10-CM

## 2020-02-02 DIAGNOSIS — F3170 Bipolar disorder, currently in remission, most recent episode unspecified: Secondary | ICD-10-CM

## 2020-02-02 DIAGNOSIS — Z124 Encounter for screening for malignant neoplasm of cervix: Secondary | ICD-10-CM | POA: Diagnosis not present

## 2020-02-02 DIAGNOSIS — Z13220 Encounter for screening for lipoid disorders: Secondary | ICD-10-CM | POA: Diagnosis not present

## 2020-02-02 DIAGNOSIS — M797 Fibromyalgia: Secondary | ICD-10-CM

## 2020-02-02 DIAGNOSIS — Z6841 Body Mass Index (BMI) 40.0 and over, adult: Secondary | ICD-10-CM

## 2020-02-02 MED ORDER — KETOROLAC TROMETHAMINE 30 MG/ML IJ SOLN: 30.0000 mg | Freq: Once | INTRAMUSCULAR | Status: DC

## 2020-02-02 MED ORDER — BARIATRIC MULTIVITAMINS/IRON PO CAPS: 1.0000 | ORAL_CAPSULE | Freq: Every day | ORAL | 3 refills | Status: AC

## 2020-02-02 MED ORDER — KETOROLAC TROMETHAMINE 60 MG/2ML IM SOLN
60.0000 mg | Freq: Once | INTRAMUSCULAR | Status: AC
  Administered 2020-02-02: 60 mg via INTRAMUSCULAR

## 2020-02-02 MED ORDER — BARIATRIC MULTIVITAMINS/IRON PO CAPS: 1.0000 | ORAL_CAPSULE | Freq: Every day | ORAL | 3 refills | Status: DC

## 2020-02-02 NOTE — Progress Notes (Signed)
Subjective:     Tina Harrison is a 64 y.o. 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  Patient states she has been doing well for the most part.  Patient endorses continued right knee pain.  In physical therapy and seen by orthopedics.  Pt having a series of 3 knee injections which did not seem to be helping.  Patient was told her knee is "bone-on-bone".  Next option may be knee replacement.  Patient also endorses increased fibromyalgia pain 2/2 change in weather.  Patient notes weight gain is unable to run 2/2 right knee pain.  Patient has a history of gastric bypass surgery.  Not currently taking daily multivitamins.  Patient endorses continued follow-up with her psychiatrist and therapist.  Patient stable on trazodone, Remeron, Klonopin for history of bipolar disorder.  Patient recently celebrated 13 years of sobriety.  Patient states colonoscopy last done in [place].  Patient plans to have repeat colonoscopy done there in April 2022.  Patient needs to schedule mammogram.  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  They have been able to maintain the store through the COVID-19 pandemic.  Social History   Socioeconomic History  . Marital status: Single    Spouse name: Not on file  . Number of children: Not on file  . Years of education: Not on file  . Highest education level: Not on file  Occupational History  . Not on file  Tobacco Use  . Smoking status: Former Research scientist (life sciences)  . Smokeless tobacco: Former Network engineer and Sexual Activity  . Alcohol use: Not Currently  . Drug use: Not Currently  . Sexual activity: Not on file  Other Topics Concern  . Not on file  Social History Narrative  . Not on file   Social Determinants of Health   Financial Resource Strain:   . Difficulty of Paying Living Expenses: Not on  file  Food Insecurity:   . Worried About Charity fundraiser in the Last Year: Not on file  . Ran Out of Food in the Last Year: Not on file  Transportation Needs:   . Lack of Transportation (Medical): Not on file  . Lack of Transportation (Non-Medical): Not on file  Physical Activity:   . Days of Exercise per Week: Not on file  . Minutes of Exercise per Session: Not on file  Stress:   . Feeling of Stress : Not on file  Social Connections:   . Frequency of Communication with Friends and Family: Not on file  . Frequency of Social Gatherings with Friends and Family: Not on file  . Attends Religious Services: Not on file  . Active Member of Clubs or Organizations: Not on file  . Attends Archivist Meetings: Not on file  . Marital Status: Not on file  Intimate Partner Violence:   . Fear of Current or Ex-Partner: Not on file  . Emotionally Abused: Not on file  . Physically Abused: Not on file  . Sexually Abused: Not on file   Health Maintenance  Topic Date Due  . Hepatitis C Screening  Never done  . COVID-19 Vaccine (1) Never done  . HIV Screening  Never done  . PAP SMEAR-Modifier  Never done  . MAMMOGRAM  Never done  . INFLUENZA VACCINE  10/23/2019  . COLONOSCOPY  08/22/2026  . TETANUS/TDAP  12/14/2028  The following portions of the patient's history were reviewed and updated as appropriate: allergies, current medications, past family history, past medical history, past social history, past surgical history and problem list.  Review of Systems Pertinent items noted in HPI and remainder of comprehensive ROS otherwise negative.   Objective:    BP 130/80 (BP Location: Left Arm, Patient Position: Sitting, Cuff Size: Normal)   Pulse 74   Temp 97.8 F (36.6 C) (Oral)   Ht '5\' 4"'  (1.626 m)   Wt 250 lb 12.8 oz (113.8 kg)   SpO2 99%   BMI 43.05 kg/m  General appearance: alert, cooperative and no distress Head: Normocephalic, without obvious abnormality,  atraumatic Eyes: conjunctivae/corneas clear. PERRL, EOM's intact. Fundi benign. Ears: normal TM's and external ear canals both ears Nose: Nares normal. Septum midline. Mucosa normal. No drainage or sinus tenderness. Throat: lips, mucosa, and tongue normal; teeth and gums normal Neck: no adenopathy, no carotid bruit, no JVD, supple, symmetrical, trachea midline and thyroid not enlarged, symmetric, no tenderness/mass/nodules Lungs: clear to auscultation bilaterally Heart: regular rate and rhythm, S1, S2 normal, no murmur, click, rub or gallop Abdomen: soft, non-tender; bowel sounds normal; no masses,  no organomegaly Pelvic: cervix normal in appearance, external genitalia normal, no adnexal masses or tenderness, no cervical motion tenderness, rectovaginal septum normal, uterus normal size, shape, and consistency and Normal vaginal rugae without erythema, scant whitish discharge in the vaginal vault Extremities: extremities normal, atraumatic, no cyanosis or edema Pulses: 2+ and symmetric Skin: Skin color, texture, turgor normal. No rashes or lesions Lymph nodes: Cervical, supraclavicular, and axillary nodes normal. Neurologic: Alert and oriented X 3, normal strength and tone. Normal symmetric reflexes. Normal coordination and gait    Assessment:    Healthy female exam with fibromyalgia flare.     Plan:     Anticipatory guidance given including wearing seatbelts, smoke detectors in the home, increasing physical activity, increasing p.o. intake of water and vegetables. -Patient congratulated on 13 years of sobriety. -We will obtain labs -Patient given information to schedule mammogram -Colonoscopy to 2022.  [place] provider. -Pap done this visit. -Given handout -Next CPE in 1 year See After Visit Summary for Counseling Recommendations    Cervical cancer screening - Plan: PAP [[facility]]  Fibromyalgia  -Discussed supportive care  including NSAIDs, massage, heat, stretching, topical analgesics -Consider water aerobics -Consider Zanaflex 4 mg if needed - Plan: ketorolac (TORADOL) 30 MG/ML injection 30 mg  Status post bariatric surgery  -Patient strongly encouraged to restart daily multivitamin -Discussed the importance of taking MVI and associated risks of not taking MVIs - Plan: Lipid panel, Hemoglobin A1c, CMP with eGFR([facility]), Vitamin B12, Vitamin D, 25-hydroxy, Folate, CBC with Differential/Platelet, TSH, T4, free, Multiple Vitamins-Minerals (BARIATRIC MULTIVITAMINS/IRON) CAPS  Screening for cholesterol level  - Plan: Lipid panel  Class 3 severe obesity due to excess calories without serious comorbidity with body mass index (BMI) of 40.0 to 44.9 in adult [facility]) -Discussed lifestyle modifications including increasing physical activity -Consider water aerobics given right knee arthritis - Plan: Lipid panel, Hemoglobin A1c, TSH, T4, free  Bipolar disorder, unspecified -Stable -Continue follow-up with psychiatry and therapist -Continue current meds including trazodone, Klonopin, Remeron  Follow-up as needed  Grier Mitts, MD

## 2020-02-02 NOTE — Addendum Note (Signed)
Addended by: Carola Rhine on: 02/02/2020 10:49 AM   Modules accepted: Orders

## 2020-02-02 NOTE — Patient Instructions (Addendum)
Preventive Care 40-64 Years Old, Female Preventive care refers to visits with your health care provider and lifestyle choices that can promote health and wellness. This includes:  A yearly physical exam. This may also be called an annual well check.  Regular dental visits and eye exams.  Immunizations.  Screening for certain conditions.  Healthy lifestyle choices, such as eating a healthy diet, getting regular exercise, not using drugs or products that contain nicotine and tobacco, and limiting alcohol use. What can I expect for my preventive care visit? Physical exam Your health care provider will check your:  Height and weight. This may be used to calculate body mass index (BMI), which tells if you are at a healthy weight.  Heart rate and blood pressure.  Skin for abnormal spots. Counseling Your health care provider may ask you questions about your:  Alcohol, tobacco, and drug use.  Emotional well-being.  Home and relationship well-being.  Sexual activity.  Eating habits.  Work and work environment.  Method of birth control.  Menstrual cycle.  Pregnancy history. What immunizations do I need?  Influenza (flu) vaccine  This is recommended every year. Tetanus, diphtheria, and pertussis (Tdap) vaccine  You may need a Td booster every 10 years. Varicella (chickenpox) vaccine  You may need this if you have not been vaccinated. Zoster (shingles) vaccine  You may need this after age 60. Measles, mumps, and rubella (MMR) vaccine  You may need at least one dose of MMR if you were born in 1957 or later. You may also need a second dose. Pneumococcal conjugate (PCV13) vaccine  You may need this if you have certain conditions and were not previously vaccinated. Pneumococcal polysaccharide (PPSV23) vaccine  You may need one or two doses if you smoke cigarettes or if you have certain conditions. Meningococcal conjugate (MenACWY) vaccine  You may need this if you  have certain conditions. Hepatitis A vaccine  You may need this if you have certain conditions or if you travel or work in places where you may be exposed to hepatitis A. Hepatitis B vaccine  You may need this if you have certain conditions or if you travel or work in places where you may be exposed to hepatitis B. Haemophilus influenzae type b (Hib) vaccine  You may need this if you have certain conditions. Human papillomavirus (HPV) vaccine  If recommended by your health care provider, you may need three doses over 6 months. You may receive vaccines as individual doses or as more than one vaccine together in one shot (combination vaccines). Talk with your health care provider about the risks and benefits of combination vaccines. What tests do I need? Blood tests  Lipid and cholesterol levels. These may be checked every 5 years, or more frequently if you are over 50 years old.  Hepatitis C test.  Hepatitis B test. Screening  Lung cancer screening. You may have this screening every year starting at age 55 if you have a 30-pack-year history of smoking and currently smoke or have quit within the past 15 years.  Colorectal cancer screening. All adults should have this screening starting at age 50 and continuing until age 75. Your health care provider may recommend screening at age 45 if you are at increased risk. You will have tests every 1-10 years, depending on your results and the type of screening test.  Diabetes screening. This is done by checking your blood sugar (glucose) after you have not eaten for a while (fasting). You may have this   done every 1-3 years.  Mammogram. This may be done every 1-2 years. Talk with your health care provider about when you should start having regular mammograms. This may depend on whether you have a family history of breast cancer.  BRCA-related cancer screening. This may be done if you have a family history of breast, ovarian, tubal, or peritoneal  cancers.  Pelvic exam and Pap test. This may be done every 3 years starting at age 75. Starting at age 47, this may be done every 5 years if you have a Pap test in combination with an HPV test. Other tests  Sexually transmitted disease (STD) testing.  Bone density scan. This is done to screen for osteoporosis. You may have this scan if you are at high risk for osteoporosis. Follow these instructions at home: Eating and drinking  Eat a diet that includes fresh fruits and vegetables, whole grains, lean protein, and low-fat dairy.  Take vitamin and mineral supplements as recommended by your health care provider.  Do not drink alcohol if: ? Your health care provider tells you not to drink. ? You are pregnant, may be pregnant, or are planning to become pregnant.  If you drink alcohol: ? Limit how much you have to 0-1 drink a day. ? Be aware of how much alcohol is in your drink. In the [place] one drink equals one 12 oz bottle of beer (355 mL), one 5 oz glass of wine (148 mL), or one 1 oz glass of hard liquor (44 mL). Lifestyle  Take daily care of your teeth and gums.  Stay active. Exercise for at least 30 minutes on 5 or more days each week.  Do not use any products that contain nicotine or tobacco, such as cigarettes, e-cigarettes, and chewing tobacco. If you need help quitting, ask your health care provider.  If you are sexually active, practice safe sex. Use a condom or other form of birth control (contraception) in order to prevent pregnancy and STIs (sexually transmitted infections).  If told by your health care provider, take low-dose aspirin daily starting at age 38. What's next?  Visit your health care provider once a year for a well check visit.  Ask your health care provider how often you should have your eyes and teeth checked.  Stay up to date on all vaccines. This information is not intended to replace advice given to you by your health care provider. Make sure you  discuss any questions you have with your health care provider. Document Revised: 11/19/2017 Document Reviewed: 11/19/2017 Elsevier Patient Education  [place].  Myofascial Pain Syndrome and Fibromyalgia Myofascial pain syndrome and fibromyalgia are both pain disorders. This pain may be felt mainly in your muscles.  Myofascial pain syndrome: ? Always has tender points in the muscle that will cause pain when pressed (trigger points). The pain may come and go. ? Usually affects your neck, upper back, and shoulder areas. The pain often radiates into your arms and hands.  Fibromyalgia: ? Has muscle pains and tenderness that come and go. ? Is often associated with fatigue and sleep problems. ? Has trigger points. ? Tends to be long-lasting (chronic), but is not life-threatening. Fibromyalgia and myofascial pain syndrome are not the same. However, they often occur together. If you have both conditions, each can make the other worse. Both are common and can cause enough pain and fatigue to make day-to-day activities difficult. Both can be hard to diagnose because their symptoms are common in many other  conditions. What are the causes? The exact causes of these conditions are not known. What increases the risk? You are more likely to develop this condition if:  You have a family history of the condition.  You have certain triggers, such as: ? Spine disorders. ? An injury (trauma) or other physical stressors. ? Being under a lot of stress. ? Medical conditions such as osteoarthritis, rheumatoid arthritis, or lupus. What are the signs or symptoms? Fibromyalgia The main symptom of fibromyalgia is widespread pain and tenderness in your muscles. Pain is sometimes described as stabbing, shooting, or burning. You may also have:  Tingling or numbness.  Sleep problems and fatigue.  Problems with attention and concentration (fibro fog). Other symptoms may include:  Bowel and bladder  problems.  Headaches.  Visual problems.  Problems with odors and noises.  Depression or mood changes.  Painful menstrual periods (dysmenorrhea).  Dry skin or eyes. These symptoms can vary over time. Myofascial pain syndrome Symptoms of myofascial pain syndrome include:  Tight, ropy bands of muscle.  Uncomfortable sensations in muscle areas. These may include aching, cramping, burning, numbness, tingling, and weakness.  Difficulty moving certain parts of the body freely (poor range of motion). How is this diagnosed? This condition may be diagnosed by your symptoms and medical history. You will also have a physical exam. In general:  Fibromyalgia is diagnosed if you have pain, fatigue, and other symptoms for more than 3 months, and symptoms cannot be explained by another condition.  Myofascial pain syndrome is diagnosed if you have trigger points in your muscles, and those trigger points are tender and cause pain elsewhere in your body (referred pain). How is this treated? Treatment for these conditions depends on the type that you have.  For fibromyalgia: ? Pain medicines, such as NSAIDs. ? Medicines for treating depression. ? Medicines for treating seizures. ? Medicines that relax the muscles.  For myofascial pain: ? Pain medicines, such as NSAIDs. ? Cooling and stretching of muscles. ? Trigger point injections. ? Sound wave (ultrasound) treatments to stimulate muscles. Treating these conditions often requires a team of health care providers. These may include:  Your primary care provider.  Physical therapist.  Complementary health care providers, such as massage therapists or acupuncturists.  Psychiatrist for cognitive behavioral therapy. Follow these instructions at home: Medicines  Take over-the-counter and prescription medicines only as told by your health care provider.  Do not drive or use heavy machinery while taking prescription pain medicine.  If you  are taking prescription pain medicine, take actions to prevent or treat constipation. Your health care provider may recommend that you: ? Drink enough fluid to keep your urine pale yellow. ? Eat foods that are high in fiber, such as fresh fruits and vegetables, whole grains, and beans. ? Limit foods that are high in fat and processed sugars, such as fried or sweet foods. ? Take an over-the-counter or prescription medicine for constipation. Lifestyle   Exercise as directed by your health care provider or physical therapist.  Practice relaxation techniques to control your stress. You may want to try: ? Biofeedback. ? Visual imagery. ? Hypnosis. ? Muscle relaxation. ? Yoga. ? Meditation.  Maintain a healthy lifestyle. This includes eating a healthy diet and getting enough sleep.  Do not use any products that contain nicotine or tobacco, such as cigarettes and e-cigarettes. If you need help quitting, ask your health care provider. General instructions  Talk to your health care provider about complementary treatments, such as acupuncture  or massage.  Consider joining a support group with others who are diagnosed with this condition.  Do not do activities that stress or strain your muscles. This includes repetitive motions and heavy lifting.  Keep all follow-up visits as told by your health care provider. This is important. Where to find more information  National Fibromyalgia Association: www.fmaware.org  Arthritis Foundation: www.arthritis.org  American Chronic Pain Association: www.theacpa.org Contact a health care provider if:  You have new symptoms.  Your symptoms get worse or your pain is severe.  You have side effects from your medicines.  You have trouble sleeping.  Your condition is causing depression or anxiety. Summary  Myofascial pain syndrome and fibromyalgia are pain disorders.  Myofascial pain syndrome has tender points in the muscle that will cause pain  when pressed (trigger points). Fibromyalgia also has muscle pains and tenderness that come and go, but this condition is often associated with fatigue and sleep disturbances.  Fibromyalgia and myofascial pain syndrome are not the same but often occur together, causing pain and fatigue that make day-to-day activities difficult.  Treatment for fibromyalgia includes taking medicines to relax the muscles and medicines for pain, depression, or seizures. Treatment for myofascial pain syndrome includes taking medicines for pain, cooling and stretching of muscles, and injecting medicines into trigger points.  Follow your health care provider's instructions for taking medicines and maintaining a healthy lifestyle. This information is not intended to replace advice given to you by your health care provider. Make sure you discuss any questions you have with your health care provider. Document Revised: 07/02/2018 Document Reviewed: 03/25/2017 Elsevier Patient Education  2020 [employer]  Preventing Unhealthy [employer], Adult Staying at a healthy weight is important to your overall health. When fat builds up in your body, you may become overweight or obese. Being overweight or obese increases your risk of developing certain health problems, such as heart disease, diabetes, sleeping problems, joint problems, and some types of cancer. Unhealthy weight gain is often the result of making unhealthy food choices or not getting enough exercise. You can make changes to your lifestyle to prevent obesity and stay as healthy as possible. What nutrition changes can be made?   Eat only as much as your body needs. To do this: ? Pay attention to signs that you are hungry or full. Stop eating as soon as you feel full. ? If you feel hungry, try drinking water first before eating. Drink enough water so your urine is clear or pale yellow. ? Eat smaller portions. Pay attention to portion sizes when eating out. ? Look at  serving sizes on food labels. Most foods contain more than one serving per container. ? Eat the recommended number of calories for your gender and activity level. For most active people, a daily total of 2,000 calories is appropriate. If you are trying to lose weight or are not very active, you may need to eat fewer calories. Talk with your health care provider or a diet and nutrition specialist (dietitian) about how many calories you need each day.  Choose healthy foods, such as: ? Fruits and vegetables. At each meal, try to fill at least half of your plate with fruits and vegetables. ? Whole grains, such as whole-wheat bread, brown rice, and quinoa. ? Lean meats, such as chicken or fish. ? Other healthy proteins, such as beans, eggs, or tofu. ? Healthy fats, such as nuts, seeds, fatty fish, and olive oil. ? Low-fat or fat-free dairy products.  Check  food labels, and avoid food and drinks that: ? Are high in calories. ? Have added sugar. ? Are high in sodium. ? Have saturated fats or trans fats.  Cook foods in healthier ways, such as by baking, broiling, or grilling.  Make a meal plan for the week, and shop with a grocery list to help you stay on track with your purchases. Try to avoid going to the grocery store when you are hungry.  When grocery shopping, try to shop around the outside of the store first, where the fresh foods are. Doing this helps you to avoid prepackaged foods, which can be high in sugar, salt (sodium), and fat. What lifestyle changes can be made?   Exercise for 30 or more minutes on 5 or more days each week. Exercising may include brisk walking, yard work, biking, running, swimming, and team sports like basketball and soccer. Ask your health care provider which exercises are safe for you.  Do muscle-strengthening activities, such as lifting weights or using resistance bands, on 2 or more days a week.  Do not use any products that contain nicotine or tobacco, such as  cigarettes and e-cigarettes. If you need help quitting, ask your health care provider.  Limit alcohol intake to no more than 1 drink a day for nonpregnant women and 2 drinks a day for men. One drink equals 12 oz of beer, 5 oz of wine, or 1 oz of hard liquor.  Try to get 7-9 hours of sleep each night. What other changes can be made?  Keep a food and activity journal to keep track of: ? What you ate and how many calories you had. Remember to count the calories in sauces, dressings, and side dishes. ? Whether you were active, and what exercises you did. ? Your calorie, weight, and activity goals.  Check your weight regularly. Track any changes. If you notice you have gained weight, make changes to your diet or activity routine.  Avoid taking weight-loss medicines or supplements. Talk to your health care provider before starting any new medicine or supplement.  Talk to your health care provider before trying any new diet or exercise plan. Why are these changes important? Eating healthy, staying active, and having healthy habits can help you to prevent obesity. Those changes also:  Help you manage stress and emotions.  Help you connect with friends and family.  Improve your self-esteem.  Improve your sleep.  Prevent long-term health problems. What can happen if changes are not made? Being obese or overweight can cause you to develop joint or bone problems, which can make it hard for you to stay active or do activities you enjoy. Being obese or overweight also puts stress on your heart and lungs and can lead to health problems like diabetes, heart disease, and some cancers. Where to find more information Talk with your health care provider or a dietitian about healthy eating and healthy lifestyle choices. You may also find information from:  [place] Department of Agriculture, My[employer]: FormerBoss.no  American Heart Association: www.heart.org  Centers for Disease Control and  Prevention: http://www.wolf.info/ Summary  Staying at a healthy weight is important to your overall health. It helps you to prevent certain diseases and health problems, such as heart disease, diabetes, joint problems, sleep disorders, and some types of cancer.  Being obese or overweight can cause you to develop joint or bone problems, which can make it hard for you to stay active or do activities you enjoy.  You can prevent  unhealthy weight gain by eating a healthy diet, exercising regularly, not smoking, limiting alcohol, and getting enough sleep.  Talk with your health care provider or a dietitian for guidance about healthy eating and healthy lifestyle choices. This information is not intended to replace advice given to you by your health care provider. Make sure you discuss any questions you have with your health care provider. Document Revised: 03/13/2017 Document Reviewed: 04/16/2016 Elsevier Patient Education  2020 [employer].

## 2020-02-03 LAB — LIPID PANEL
Cholesterol: 176 mg/dL (ref <200)
HDL: 92 mg/dL (ref >=50)
LDL Cholesterol (Calc): 71 mg/dL (calc)
Non-HDL Cholesterol (Calc): 84 mg/dL (calc) (ref <130)
Total CHOL/HDL Ratio: 1.9 (calc) (ref <5.0)
Triglycerides: 53 mg/dL (ref <150)

## 2020-02-03 LAB — CBC WITH DIFFERENTIAL/PLATELET
Absolute Monocytes: 372 cells/uL (ref 200–950)
Basophils Absolute: 19 cells/uL (ref 0–200)
Basophils Relative: 0.3 %
Eosinophils Absolute: 93 cells/uL (ref 15–500)
Eosinophils Relative: 1.5 %
HCT: 39.5 % (ref 35.0–45.0)
Hemoglobin: 13.4 g/dL (ref 11.7–15.5)
Lymphs Abs: 2139 cells/uL (ref 850–3900)
MCH: 31 pg (ref 27.0–33.0)
MCHC: 33.9 g/dL (ref 32.0–36.0)
MCV: 91.4 fL (ref 80.0–100.0)
MPV: 10.1 fL (ref 7.5–12.5)
Monocytes Relative: 6 %
Neutro Abs: 3577 cells/uL (ref 1500–7800)
Neutrophils Relative %: 57.7 %
Platelets: 267 10*3/uL (ref 140–400)
RBC: 4.32 10*6/uL (ref 3.80–5.10)
RDW: 12.5 % (ref 11.0–15.0)
Total Lymphocyte: 34.5 %
WBC: 6.2 10*3/uL (ref 3.8–10.8)

## 2020-02-03 LAB — COMPLETE METABOLIC PANEL WITH GFR
AG Ratio: 1.6 (calc) (ref 1.0–2.5)
ALT: 20 U/L (ref 6–29)
AST: 20 U/L (ref 10–35)
Albumin: 4.3 g/dL (ref 3.6–5.1)
Alkaline phosphatase (APISO): 102 U/L (ref 37–153)
BUN: 18 mg/dL (ref 7–25)
CO2: 25 mmol/L (ref 20–32)
Calcium: 9.4 mg/dL (ref 8.6–10.4)
Chloride: 104 mmol/L (ref 98–110)
Creat: 0.69 mg/dL (ref 0.50–0.99)
GFR, Est African American: 107 mL/min/{1.73_m2} (ref >=60)
GFR, Est Non African American: 92 mL/min/{1.73_m2} (ref >=60)
Globulin: 2.7 g/dL (calc) (ref 1.9–3.7)
Glucose, Bld: 86 mg/dL (ref 65–99)
Potassium: 4 mmol/L (ref 3.5–5.3)
Sodium: 139 mmol/L (ref 135–146)
Total Bilirubin: 0.3 mg/dL (ref 0.2–1.2)
Total Protein: 7 g/dL (ref 6.1–8.1)

## 2020-02-03 LAB — HEMOGLOBIN A1C
Hgb A1c MFr Bld: 5.5 % of total Hgb (ref <5.7)
Mean Plasma Glucose: 111 (calc)
eAG (mmol/L): 6.2 (calc)

## 2020-02-03 LAB — VITAMIN B12: Vitamin B-12: 762 pg/mL (ref 200–1100)

## 2020-02-03 LAB — TSH: TSH: 1.6 mIU/L (ref 0.40–4.50)

## 2020-02-03 LAB — CYTOLOGY - PAP
Adequacy: ABSENT
Comment: NEGATIVE
Comment: NEGATIVE
Diagnosis: NEGATIVE
High risk HPV: NEGATIVE
Trichomonas: NEGATIVE

## 2020-02-03 LAB — VITAMIN D 25 HYDROXY (VIT D DEFICIENCY, FRACTURES): Vit D, 25-Hydroxy: 20 ng/mL — ABNORMAL LOW (ref 30–100)

## 2020-02-03 LAB — FOLATE: Folate: 12.2 ng/mL

## 2020-02-03 LAB — T4, FREE: Free T4: 1.1 ng/dL (ref 0.8–1.8)

## 2020-02-05 ENCOUNTER — Other Ambulatory Visit: Payer: Self-pay | Admitting: Family Medicine

## 2020-02-05 DIAGNOSIS — E559 Vitamin D deficiency, unspecified: Secondary | ICD-10-CM

## 2020-02-05 MED ORDER — VITAMIN D (ERGOCALCIFEROL) 1.25 MG (50000 UNIT) PO CAPS: 50000.0000 [IU] | ORAL_CAPSULE | ORAL | 0 refills | Status: DC

## 2020-02-07 ENCOUNTER — Telehealth: Payer: Self-pay | Admitting: Family Medicine

## 2020-02-07 NOTE — Telephone Encounter (Signed)
Spoke with pt pharmacy state that they dispensed the wrong multivitamin to pt, advised pharmacy of the correct prescription that was sent by Dr Salomon Fick. The pharmacist state that they will process the refill and they will notify pt when Rx is ready for pick up. Spoke with pt confirmed that her pharmacy dispensed the wrong multivitamin. Pt verbalized understanding

## 2020-02-07 NOTE — Telephone Encounter (Signed)
Patient is calling and wanted to speak to someone regarding medication, please advise. CB is [phone]

## 2020-03-07 ENCOUNTER — Other Ambulatory Visit: Payer: Self-pay | Admitting: Family Medicine

## 2020-03-07 DIAGNOSIS — E559 Vitamin D deficiency, unspecified: Secondary | ICD-10-CM

## 2020-04-06 ENCOUNTER — Other Ambulatory Visit: Payer: Self-pay | Admitting: Family Medicine

## 2020-06-25 ENCOUNTER — Other Ambulatory Visit: Payer: Self-pay | Admitting: Family Medicine

## 2020-06-25 ENCOUNTER — Ambulatory Visit: Payer: BC Managed Care – PPO | Admitting: Family Medicine

## 2020-06-25 ENCOUNTER — Other Ambulatory Visit: Payer: Self-pay

## 2020-06-25 ENCOUNTER — Encounter: Payer: Self-pay | Admitting: Family Medicine

## 2020-06-25 VITALS — BP 132/96 | HR 95 | Temp 98.3°F | Wt 260.2 lb

## 2020-06-25 DIAGNOSIS — R030 Elevated blood-pressure reading, without diagnosis of hypertension: Secondary | ICD-10-CM

## 2020-06-25 DIAGNOSIS — M1711 Unilateral primary osteoarthritis, right knee: Secondary | ICD-10-CM | POA: Diagnosis not present

## 2020-06-25 DIAGNOSIS — E559 Vitamin D deficiency, unspecified: Secondary | ICD-10-CM | POA: Diagnosis not present

## 2020-06-25 DIAGNOSIS — R635 Abnormal weight gain: Secondary | ICD-10-CM

## 2020-06-25 DIAGNOSIS — Z1231 Encounter for screening mammogram for malignant neoplasm of breast: Secondary | ICD-10-CM

## 2020-06-25 LAB — CBC WITH DIFFERENTIAL/PLATELET
Basophils Absolute: 0 10*3/uL (ref 0.0–0.1)
Basophils Relative: 0.6 % (ref 0.0–3.0)
Eosinophils Absolute: 0.1 10*3/uL (ref 0.0–0.7)
Eosinophils Relative: 1 % (ref 0.0–5.0)
HCT: 38.1 % (ref 36.0–46.0)
Hemoglobin: 13 g/dL (ref 12.0–15.0)
Lymphocytes Relative: 33.8 % (ref 12.0–46.0)
Lymphs Abs: 1.8 10*3/uL (ref 0.7–4.0)
MCHC: 34.2 g/dL (ref 30.0–36.0)
MCV: 88.5 fl (ref 78.0–100.0)
Monocytes Absolute: 0.4 10*3/uL (ref 0.1–1.0)
Monocytes Relative: 6.5 % (ref 3.0–12.0)
Neutro Abs: 3.2 10*3/uL (ref 1.4–7.7)
Neutrophils Relative %: 58.1 % (ref 43.0–77.0)
Platelets: 255 10*3/uL (ref 150.0–400.0)
RBC: 4.31 Mil/uL (ref 3.87–5.11)
RDW: 14 % (ref 11.5–15.5)
WBC: 5.5 10*3/uL (ref 4.0–10.5)

## 2020-06-25 LAB — COMPREHENSIVE METABOLIC PANEL
ALT: 17 U/L (ref 0–35)
AST: 19 U/L (ref 0–37)
Albumin: 4.3 g/dL (ref 3.5–5.2)
Alkaline Phosphatase: 125 U/L — ABNORMAL HIGH (ref 39–117)
BUN: 6 mg/dL (ref 6–23)
CO2: 28 mEq/L (ref 19–32)
Calcium: 9.7 mg/dL (ref 8.4–10.5)
Chloride: 105 mEq/L (ref 96–112)
Creatinine, Ser: 0.7 mg/dL (ref 0.40–1.20)
GFR: 91.21 mL/min (ref >60.00)
Glucose, Bld: 105 mg/dL — ABNORMAL HIGH (ref 70–99)
Potassium: 4.2 mEq/L (ref 3.5–5.1)
Sodium: 140 mEq/L (ref 135–145)
Total Bilirubin: 0.5 mg/dL (ref 0.2–1.2)
Total Protein: 7.5 g/dL (ref 6.0–8.3)

## 2020-06-25 LAB — T4, FREE: Free T4: 1.25 ng/dL (ref 0.60–1.60)

## 2020-06-25 LAB — TSH: TSH: 1.34 u[IU]/mL (ref 0.35–4.50)

## 2020-06-25 LAB — VITAMIN D 25 HYDROXY (VIT D DEFICIENCY, FRACTURES): VITD: 31.5 ng/mL (ref 30.00–100.00)

## 2020-06-25 NOTE — Patient Instructions (Addendum)
Osteoarthritis  Osteoarthritis is a type of arthritis. It refers to joint pain or joint disease. Osteoarthritis affects tissue that covers the ends of bones in joints (cartilage). Cartilage acts as a cushion between the bones and helps them move smoothly. Osteoarthritis occurs when cartilage in the joints gets worn down. Osteoarthritis is sometimes called "wear and tear" arthritis. Osteoarthritis is the most common form of arthritis. It often occurs in older people. It is a condition that gets worse over time. The joints most often affected by this condition are in the fingers, toes, hips, knees, and spine, including the neck and lower back. What are the causes? This condition is caused by the wearing down of cartilage that covers the ends of bones. What increases the risk? The following factors may make you more likely to develop this condition:  Being age 50 or older.  Obesity.  Overuse of joints.  Past injury of a joint.  Past surgery on a joint.  Family history of osteoarthritis. What are the signs or symptoms? The main symptoms of this condition are pain, swelling, and stiffness in the joint. Other symptoms may include:  An enlarged joint.  More pain and further damage caused by small pieces of bone or cartilage that break off and float inside of the joint.  Small deposits of bone (osteophytes) that grow on the edges of the joint.  A grating or scraping feeling inside the joint when you move it.  Popping or creaking sounds when you move.  Difficulty walking or exercising.  An inability to grip items, twist your hand(s), or control the movements of your hands and fingers. How is this diagnosed? This condition may be diagnosed based on:  Your medical history.  A physical exam.  Your symptoms.  X-rays of the affected joint(s).  Blood tests to rule out other types of arthritis. How is this treated? There is no cure for this condition, but treatment can help control  pain and improve joint function. Treatment may include a combination of therapies, such as:  Pain relief techniques, such as: ? Applying heat and cold to the joint. ? Massage. ? A form of talk therapy called cognitive behavioral therapy (CBT). This therapy helps you set goals and follow up on the changes that you make.  Medicines for pain and inflammation. The medicines can be taken by mouth or applied to the skin. They include: ? NSAIDs, such as ibuprofen. ? Prescription medicines. ? Strong anti-inflammatory medicines (corticosteroids). ? Certain nutritional supplements.  A prescribed exercise program. You may work with a physical therapist.  Assistive devices, such as a brace, wrap, splint, specialized glove, or cane.  A weight control plan.  Surgery, such as: ? An osteotomy. This is done to reposition the bones and relieve pain or to remove loose pieces of bone and cartilage. ? Joint replacement surgery. You may need this surgery if you have advanced osteoarthritis. Follow these instructions at home: Activity  Rest your affected joints as told by your health care provider.  Exercise as told by your health care provider. He or she may recommend specific types of exercise, such as: ? Strengthening exercises. These are done to strengthen the muscles that support joints affected by arthritis. ? Aerobic activities. These are exercises, such as brisk walking or water aerobics, that increase your heart rate. ? Range-of-motion activities. These help your joints move more easily. ? Balance and agility exercises. Managing pain, stiffness, and swelling  If directed, apply heat to the affected area as often   as told by your health care provider. Use the heat source that your health care provider recommends, such as a moist heat pack or a heating pad. ? If you have a removable assistive device, remove it as told by your health care provider. ? Place a towel between your skin and the heat  source. If your health care provider tells you to keep the assistive device on while you apply heat, place a towel between the assistive device and the heat source. ? Leave the heat on for 20-30 minutes. ? Remove the heat if your skin turns bright red. This is especially important if you are unable to feel pain, heat, or cold. You may have a greater risk of getting burned.  If directed, put ice on the affected area. To do this: ? If you have a removable assistive device, remove it as told by your health care provider. ? Put ice in a plastic bag. ? Place a towel between your skin and the bag. If your health care provider tells you to keep the assistive device on during icing, place a towel between the assistive device and the bag. ? Leave the ice on for 20 minutes, 2-3 times a day. ? Move your fingers or toes often to reduce stiffness and swelling. ? Raise (elevate) the injured area above the level of your heart while you are sitting or lying down.      General instructions  Take over-the-counter and prescription medicines only as told by your health care provider.  Maintain a healthy weight. Follow instructions from your health care provider for weight control.  Do not use any products that contain nicotine or tobacco, such as cigarettes, e-cigarettes, and chewing tobacco. If you need help quitting, ask your health care provider.  Use assistive devices as told by your health care provider.  Keep all follow-up visits as told by your health care provider. This is important. Where to find more information  [employer] of Arthritis and Musculoskeletal and Skin Diseases: www.niams.SouthExposed.es  [employer] on Aging: http://kim-miller.com/  American College of Rheumatology: www.rheumatology.org Contact a health care provider if:  You have redness, swelling, or a feeling of warmth in a joint that gets worse.  You have a fever along with joint or muscle aches.  You develop a  rash.  You have trouble doing your normal activities. Get help right away if:  You have pain that gets worse and is not relieved by pain medicine. Summary  Osteoarthritis is a type of arthritis that affects tissue covering the ends of bones in joints (cartilage).  This condition is caused by the wearing down of cartilage that covers the ends of bones.  The main symptom of this condition is pain, swelling, and stiffness in the joint.  There is no cure for this condition, but treatment can help control pain and improve joint function. This information is not intended to replace advice given to you by your health care provider. Make sure you discuss any questions you have with your health care provider. Document Revised: 03/07/2019 Document Reviewed: 03/07/2019 Elsevier Patient Education  2021 [place].  Vitamin D Deficiency Vitamin D deficiency is when your body does not have enough vitamin D. Vitamin D is important to your body for many reasons:  It helps the body absorb two important minerals--calcium and phosphorus.  It plays a role in bone health.  It may help to prevent some diseases, such as diabetes and multiple sclerosis.  It plays a role in  muscle function, including heart function. If vitamin D deficiency is severe, it can cause a condition in which your bones become soft. In adults, this condition is called osteomalacia. In children, this condition is called rickets. What are the causes? This condition may be caused by:  Not eating enough foods that contain vitamin D.  Not getting enough natural sun exposure.  Having certain digestive system diseases that make it difficult for your body to absorb vitamin D. These diseases include Crohn's disease, chronic pancreatitis, and cystic fibrosis.  Having a surgery in which a part of the stomach or a part of the small intestine is removed.  Having chronic kidney disease or liver disease. What increases the risk? You  are more likely to develop this condition if you:  Are older.  Do not spend much time outdoors.  Live in a long-term care facility.  Have had broken bones.  Have weak or thin bones (osteoporosis).  Have a disease or condition that changes how the body absorbs vitamin D.  Have dark skin.  Take certain medicines, such as steroid medicines or certain seizure medicines.  Are overweight or obese. What are the signs or symptoms? In mild cases of vitamin D deficiency, there may not be any symptoms. If the condition is severe, symptoms may include:  Bone pain.  Muscle pain.  Falling often.  Broken bones caused by a minor injury. How is this diagnosed? This condition may be diagnosed with blood tests. Imaging tests such as X-rays may also be done to look for changes in the bone. How is this treated? Treatment for this condition may depend on what caused the condition. Treatment options include:  Taking vitamin D supplements. Your health care provider will suggest what dose is best for you.  Taking a calcium supplement. Your health care provider will suggest what dose is best for you. Follow these instructions at home: Eating and drinking  Eat foods that contain vitamin D. Choices include: ? Fortified dairy products, cereals, or juices. Fortified means that vitamin D has been added to the food. Check the label on the package to see if the food is fortified. ? Fatty fish, such as salmon or trout. ? Eggs. ? Oysters. ? Mushrooms. The items listed above may not be a complete list of recommended foods and beverages. Contact a dietitian for more information.   General instructions  Take medicines and supplements only as told by your health care provider.  Get regular, safe exposure to natural sunlight.  Do not use a tanning bed.  Maintain a healthy weight. Lose weight if needed.  Keep all follow-up visits as told by your health care provider. This is important. How is this  prevented? You can get vitamin D by:  Eating foods that naturally contain vitamin D.  Eating or drinking products that have been fortified with vitamin D, such as cereals, juices, and dairy products (including milk).  Taking a vitamin D supplement or a multivitamin supplement that contains vitamin D.  Being in the sun. Your body naturally makes vitamin D when your skin is exposed to sunlight. Your body changes the sunlight into a form of the vitamin that it can use. Contact a health care provider if:  Your symptoms do not go away.  You feel nauseous or you vomit.  You have fewer bowel movements than usual or are constipated. Summary  Vitamin D deficiency is when your body does not have enough vitamin D.  Vitamin D is important to your body  for good bone health and muscle function, and it may help prevent some diseases.  Vitamin D deficiency is primarily treated through supplementation. Your health care provider will suggest what dose is best for you.  You can get vitamin D by eating foods that contain vitamin D, by being in the sun, and by taking a vitamin D supplement or a multivitamin supplement that contains vitamin D. This information is not intended to replace advice given to you by your health care provider. Make sure you discuss any questions you have with your health care provider. Document Revised: 11/16/2017 Document Reviewed: 11/16/2017 Elsevier Patient Education  2021 [place].  Exercises to do While Sitting  Exercises that you do while sitting (chair exercises) can give you many of the same benefits as full exercise. Benefits include strengthening your heart, burning calories, and keeping muscles and joints healthy. Exercise can also improve your mood and help with depression and anxiety. You may benefit from chair exercises if you are unable to do standing exercises because of:  Diabetic foot pain.  Obesity.  Illness.  Arthritis.  Recovery from surgery or  injury.  Breathing problems.  Balance problems.  Another type of disability. Before starting chair exercises, check with your health care provider or a physical therapist to find out how much exercise you can tolerate and which exercises are safe for you. If your health care provider approves:  Start out slowly and build up over time. Aim to work up to about 10-20 minutes for each exercise session.  Make exercise part of your daily routine.  Drink water when you exercise. Do not wait until you are thirsty. Drink every 10-15 minutes.  Stop exercising right away if you have pain, nausea, shortness of breath, or dizziness.  If you are exercising in a wheelchair, make sure to lock the wheels.  Ask your health care provider whether you can do tai chi or yoga. Many positions in these mind-body exercises can be modified to do while seated. Warm-up Before starting other exercises: 1. Sit up as straight as you can. Have your knees bent at 90 degrees, which is the shape of the capital letter "L." Keep your feet flat on the floor. 2. Sit at the front edge of your chair, if you can. 3. Pull in (tighten) the muscles in your abdomen and stretch your spine and neck as straight as you can. Hold this position for a few minutes. 4. Breathe in and out evenly. Try to concentrate on your breathing, and relax your mind. Stretching Exercise A: Arm stretch 1. Hold your arms out straight in front of your body. 2. Bend your hands at the wrist with your fingers pointing up, as if signaling someone to stop. Notice the slight tension in your forearms as you hold the position. 3. Keeping your arms out and your hands bent, rotate your hands outward as far as you can and hold this stretch. Aim to have your thumbs pointing up and your pinkie fingers pointing down. Slowly repeat arm stretches for one minute as tolerated. Exercise B: Leg stretch 1. If you can move your legs, try to "draw" letters on the floor with the  toes of your foot. Write your name with one foot. 2. Write your name with the toes of your other foot. Slowly repeat the movements for one minute as tolerated. Exercise C: Reach for the sky 1. Reach your hands as far over your head as you can to stretch your spine. 2. Move your  hands and arms as if you are climbing a rope. Slowly repeat the movements for one minute as tolerated. Range of motion exercises Exercise A: Shoulder roll 1. Let your arms hang loosely at your sides. 2. Lift just your shoulders up toward your ears, then let them relax back down. 3. When your shoulders feel loose, rotate your shoulders in backward and forward circles. Do shoulder rolls slowly for one minute as tolerated. Exercise B: March in place 1. As if you are marching, pump your arms and lift your legs up and down. Lift your knees as high as you can. ? If you are unable to lift your knees, just pump your arms and move your ankles and feet up and down. March in place for one minute as tolerated. Exercise C: Seated jumping jacks 1. Let your arms hang down straight. 2. Keeping your arms straight, lift them up over your head. Aim to point your fingers to the ceiling. 3. While you lift your arms, straighten your legs and slide your heels along the floor to your sides, as wide as you can. 4. As you bring your arms back down to your sides, slide your legs back together. ? If you are unable to use your legs, just move your arms. Slowly repeat seated jumping jacks for one minute as tolerated. Strengthening exercises Exercise A: Shoulder squeeze 1. Hold your arms straight out from your body to your sides, with your elbows bent and your fists pointed at the ceiling. 2. Keeping your arms in the bent position, move them forward so your elbows and forearms meet in front of your face. 3. Open your arms back out as wide as you can with your elbows still bent, until you feel your shoulder blades squeezing together. Hold for 5  seconds. Slowly repeat the movements forward and backward for one minute as tolerated. Contact a health care provider if you:  Had to stop exercising due to any of the following: ? Pain. ? Nausea. ? Shortness of breath. ? Dizziness. ? Fatigue.  Have significant pain or soreness after exercising. Get help right away if you have:  Chest pain.  Difficulty breathing. These symptoms may represent a serious problem that is an emergency. Do not wait to see if the symptoms will go away. Get medical help right away. Call your local emergency services (911 in the [place] Do not drive yourself to the hospital. This information is not intended to replace advice given to you by your health care provider. Make sure you discuss any questions you have with your health care provider. Document Revised: 07/07/2019 Document Reviewed: 07/07/2019 Elsevier Patient Education  2021 [employer].

## 2020-06-25 NOTE — Progress Notes (Signed)
Subjective:    Patient ID: Tina Harrison, female    DOB: 09/23/1955, 65 y.o.   MRN: [id number]  Chief Complaint  Patient presents with  . Follow-up    HPI Patient os a 65 yo female with pmh sig for bipolar d/o, PTSD, fibromyalgia, s/p gastric bypass x 2was seen today for for f/u.  Pt doing well inquires about rechecking vit D level.  Having R knee pain.  Tried injections.  Ortho advises TKR.  Pt considering moving back to [place] in a few months, so would want to have surgery there.  Pt endorses wt gain as unable to exercise 2/2 knee pain.  Following with [facility].   Past Medical History:  Diagnosis Date  . Arthritis   . Colon polyps   . Depression   . Urinary incontinence     Allergies  Allergen Reactions  . Morphine And Related     Causes hallucinations     ROS General: Denies fever, chills, night sweats, changes in appetite  + weight gain HEENT: Denies headaches, ear pain, changes in vision, rhinorrhea, sore throat CV: Denies CP, palpitations, SOB, orthopnea Pulm: Denies SOB, cough, wheezing GI: Denies abdominal pain, nausea, vomiting, diarrhea, constipation GU: Denies dysuria, hematuria, frequency, vaginal discharge Msk: Denies muscle cramps, joint pains +R knee pain Neuro: Denies weakness, numbness, tingling Skin: Denies rashes, bruising Psych: Denies depression, anxiety, hallucinations  Objective:    Blood pressure (!) 132/96, pulse 95, temperature 98.3 F (36.8 C), temperature source Oral, weight 260 lb 3.2 oz (118 kg), SpO2 95 %.  Gen. Pleasant, well-nourished, in no distress, normal affect   HEENT: [place]/AT, face symmetric, conjunctiva clear, no scleral icterus, PERRLA, EOMI, nares patent without drainage Lungs: no accessory muscle use Cardiovascular: RRR, no m/r/g, no peripheral edema Musculoskeletal: No deformities, no cyanosis or clubbing, normal tone Neuro:  A&Ox3, CN II-XII intact, normal gait Skin:  Warm, no lesions/ rash   Wt Readings from Last 3 Encounters:   06/25/20 260 lb 3.2 oz (118 kg)  02/02/20 250 lb 12.8 oz (113.8 kg)  07/26/19 230 lb (104.3 kg)    Lab Results  Component Value Date   WBC 6.2 02/02/2020   HGB 13.4 02/02/2020   HCT 39.5 02/02/2020   PLT 267 02/02/2020   GLUCOSE 86 02/02/2020   CHOL 176 02/02/2020   TRIG 53 02/02/2020   HDL 92 02/02/2020   LDLCALC 71 02/02/2020   ALT 20 02/02/2020   AST 20 02/02/2020   NA 139 02/02/2020   K 4.0 02/02/2020   CL 104 02/02/2020   CREATININE 0.69 02/02/2020   BUN 18 02/02/2020   CO2 25 02/02/2020   TSH 1.60 02/02/2020   HGBA1C 5.5 02/02/2020    Assessment/Plan:  Vitamin D deficiency -vit D 20 on 02/02/20 -s/p ergocalciferol wkly, will recheck vit D level  - Plan: Vitamin D, 25-hydroxy  Osteoarthritis of right knee, unspecified osteoarthritis type -discussed supportive care -continue f/u with Ortho -given plan to move, find new Ortho out of state for R TKR.  - Plan: CBC with Differential/Platelet  Weight gain -s/p gastric bypass -discussed self care to avoid stress eating and lifestyle modifications -consider chair exercises - Plan: TSH, T4, Free, CMP  Elevated blood pressure reading -recheck -possibly elevated 2/2 knee pain -lifestyle modifications -continue to monitor  F/u in 1 month  Abbe Amsterdam, MD

## 2020-07-17 NOTE — Progress Notes (Signed)
Patient reviewed results on MyChart. 

## 2020-08-14 ENCOUNTER — Other Ambulatory Visit: Payer: Self-pay

## 2020-08-14 ENCOUNTER — Ambulatory Visit
Admission: RE | Admit: 2020-08-14 | Discharge: 2020-08-14 | Disposition: A | Discharge status: Home / Self Care | Payer: BC Managed Care – PPO | Source: Ambulatory Visit | Attending: Family Medicine | Admitting: Family Medicine

## 2020-08-14 DIAGNOSIS — Z1231 Encounter for screening mammogram for malignant neoplasm of breast: Secondary | ICD-10-CM

## 2020-08-14 LAB — HM MAMMOGRAPHY

## 2020-08-23 ENCOUNTER — Encounter: Payer: Self-pay | Admitting: Family Medicine

## 2022-08-02 IMAGING — MG MM DIGITAL SCREENING BILAT W/ TOMO AND CAD
8 series · 8 of 24 positions shown · non-contrast
Comparison: None.

ACR Breast Density Category a: The breast tissue is almost entirely
fatty.

CLINICAL DATA: Screening.

EXAM:
DIGITAL SCREENING BILATERAL MAMMOGRAM WITH TOMOSYNTHESIS AND CAD
TECHNIQUE: Bilateral screening digital craniocaudal and mediolateral oblique
mammograms were obtained. Bilateral screening digital breast
tomosynthesis was performed. The images were evaluated with
computer-aided detection.

[R MLO synth-2D]
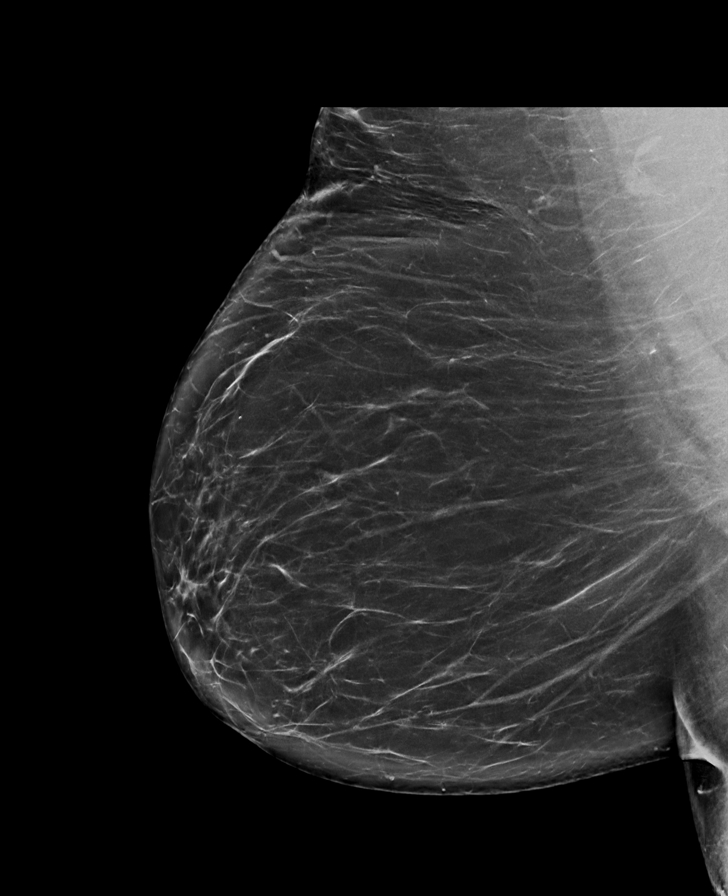

[R CC synth-2D]
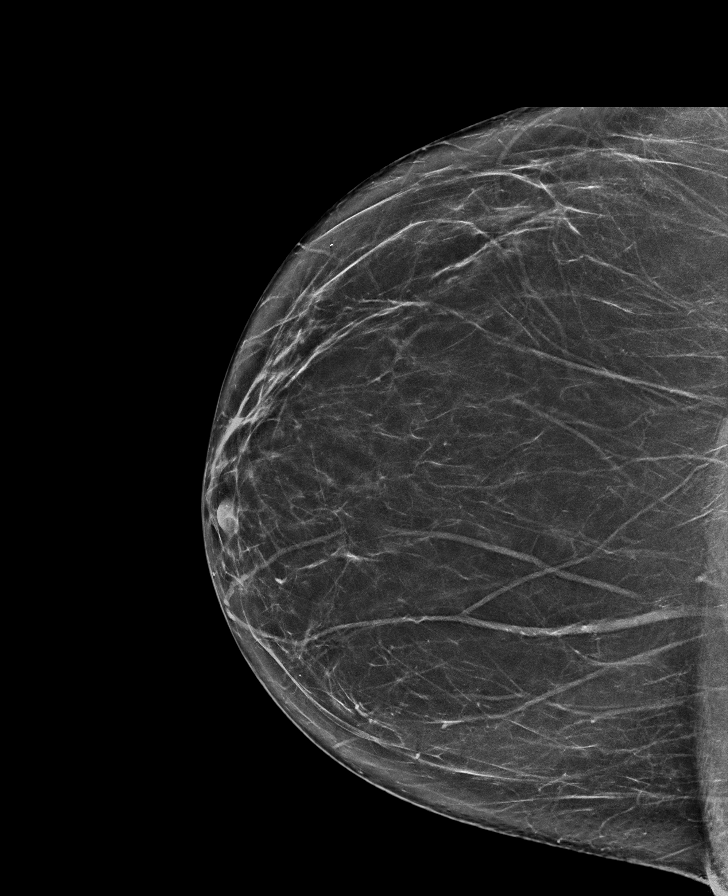

[L MLO synth-2D]
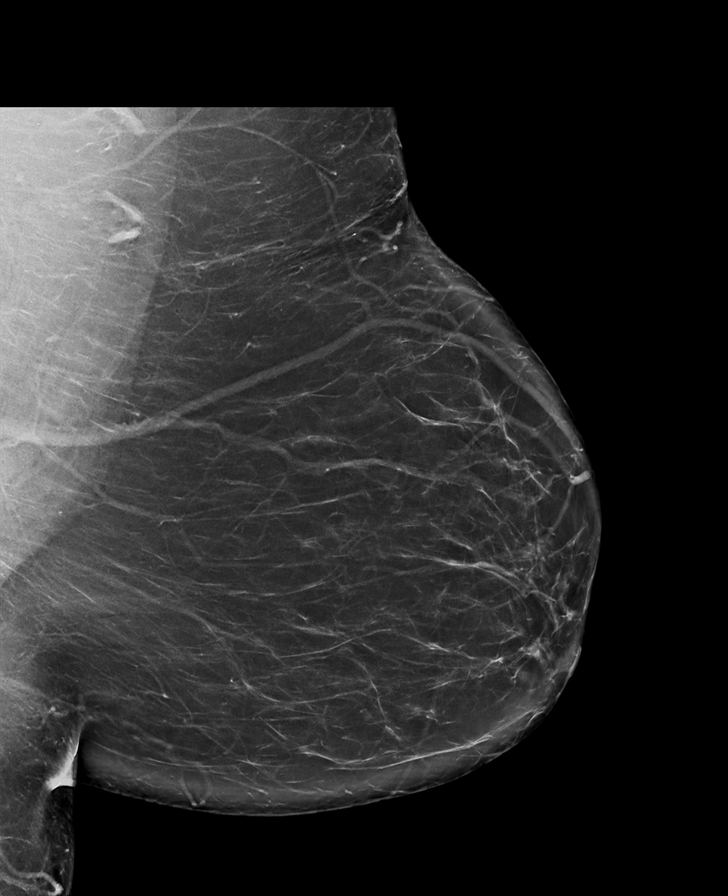

[L CC synth-2D]
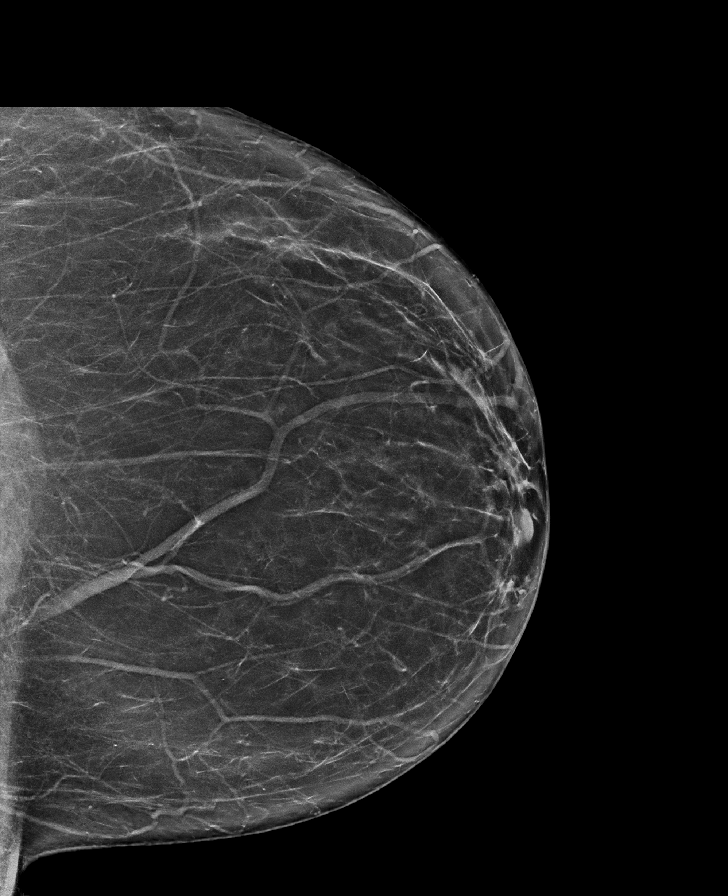

[L MLO tomo · tomo slice 49/96.0]
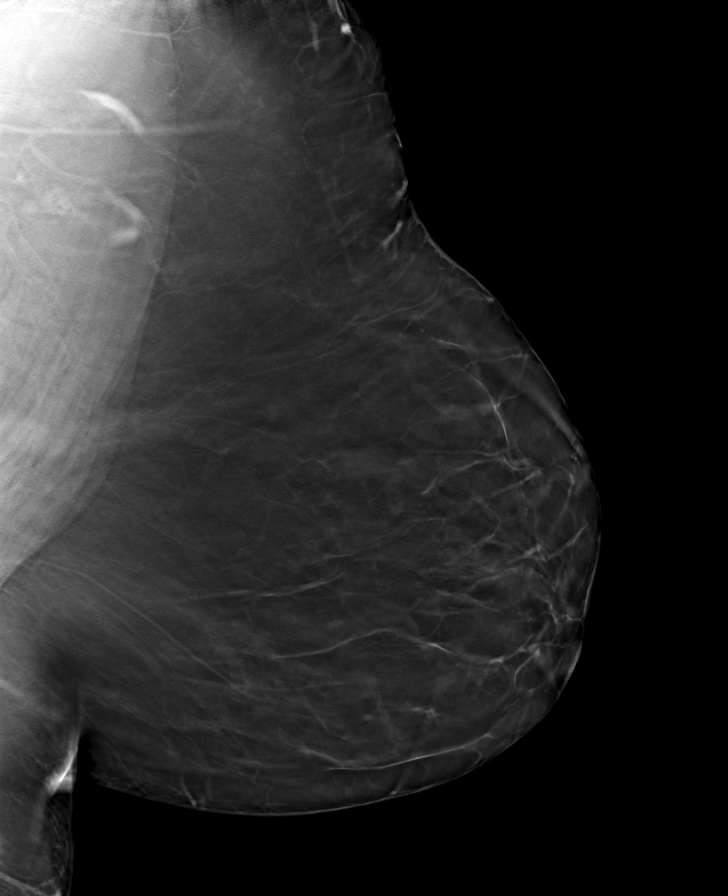

[L CC tomo · tomo slice 37/73.0]
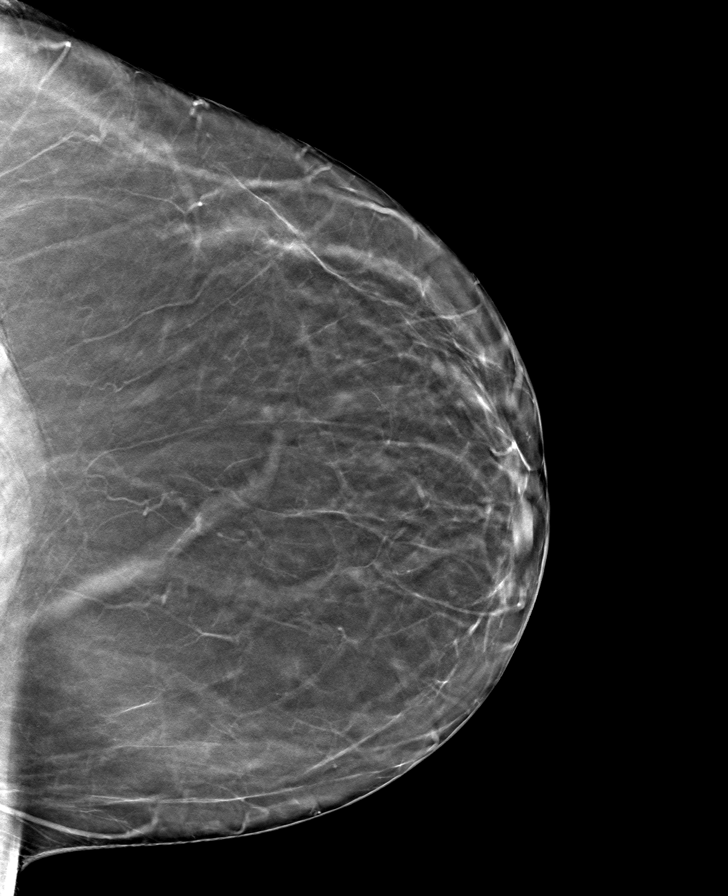

[R MLO tomo · tomo slice 48/95.0]
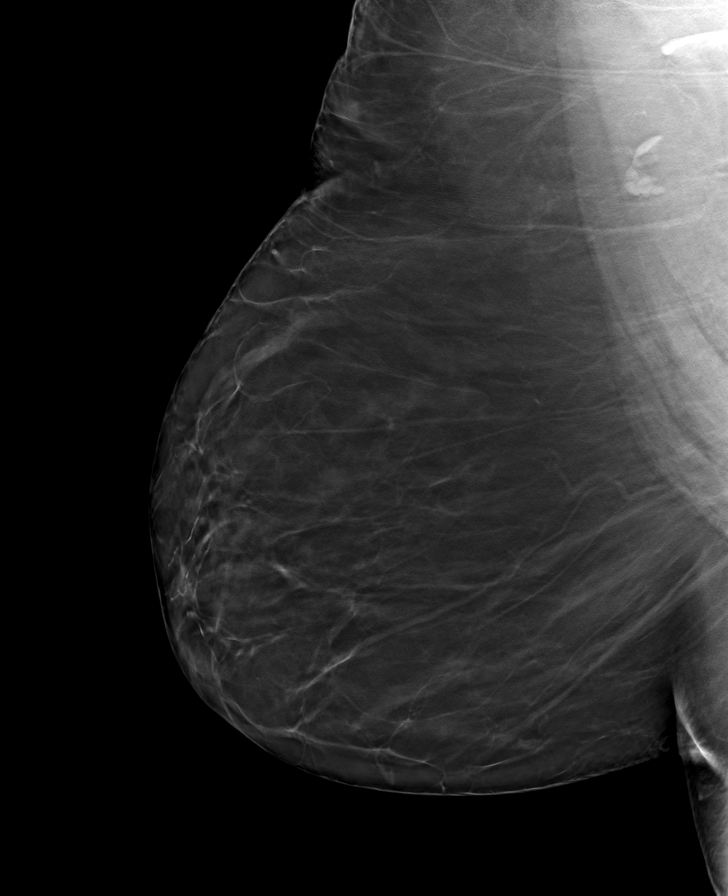

[R CC tomo · tomo slice 38/75.0]
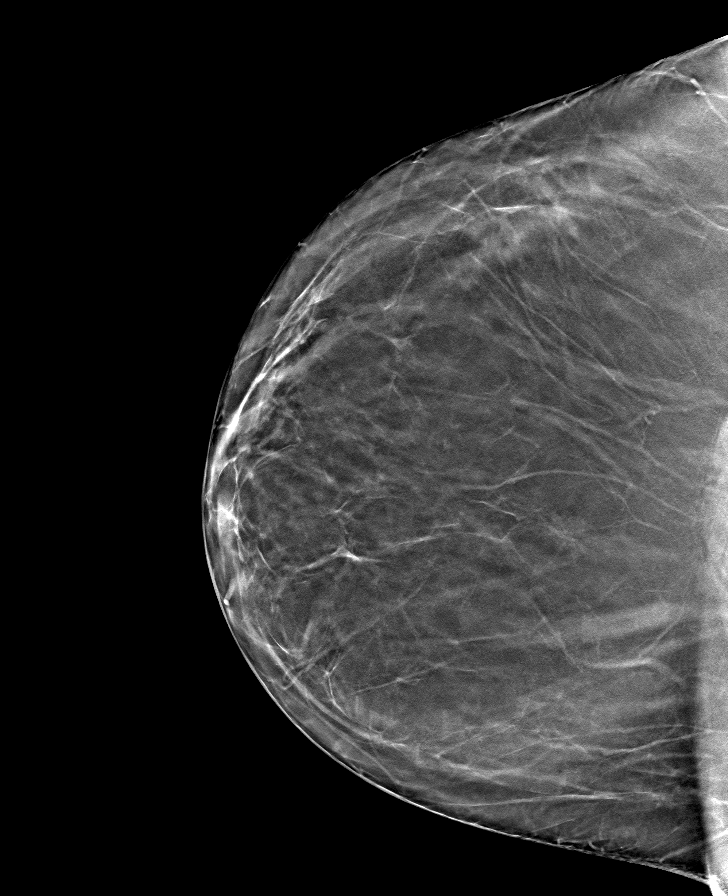

[8 of 24 positions shown; findings below may reference images not displayed]

FINDINGS: There are no findings suspicious for malignancy. The images were
evaluated with computer-aided detection.
IMPRESSION: No mammographic evidence of malignancy. A result letter of this
screening mammogram will be mailed directly to the patient.

RECOMMENDATION:
Screening mammogram in one year. (Code:[id number]-[id number])

BI-RADS CATEGORY  1: Negative.
